# Patient Record
Sex: Female | Born: 1980 | Race: White | Hispanic: No | Marital: Married | Smoking: Never smoker
Health system: Southern US, Community
[De-identification: ages and names within clinical notes are randomized; demographics above are authoritative.]

## PROBLEM LIST (undated history)

## (undated) DIAGNOSIS — I471 Supraventricular tachycardia, unspecified: Secondary | ICD-10-CM

## (undated) DIAGNOSIS — E663 Overweight: Secondary | ICD-10-CM

## (undated) DIAGNOSIS — R000 Tachycardia, unspecified: Secondary | ICD-10-CM

## (undated) DIAGNOSIS — F419 Anxiety disorder, unspecified: Secondary | ICD-10-CM

## (undated) HISTORY — DX: Tachycardia, unspecified: R00.0

## (undated) HISTORY — DX: Overweight: E66.3

## (undated) HISTORY — PX: BREAST ENHANCEMENT SURGERY: SHX7

## (undated) HISTORY — DX: Anxiety disorder, unspecified: F41.9

## (undated) HISTORY — PX: TONSILLECTOMY: SUR1361

## (undated) HISTORY — PX: CHOLECYSTECTOMY: SHX55

---

## 2006-06-18 ENCOUNTER — Emergency Department (HOSPITAL_COMMUNITY): Admission: EM | Admit: 2006-06-18 | Discharge: 2006-06-18 | Payer: Self-pay | Admitting: Emergency Medicine

## 2007-03-30 ENCOUNTER — Ambulatory Visit: Payer: Self-pay | Admitting: Internal Medicine

## 2007-03-30 DIAGNOSIS — K5909 Other constipation: Secondary | ICD-10-CM

## 2007-03-30 DIAGNOSIS — F329 Major depressive disorder, single episode, unspecified: Secondary | ICD-10-CM

## 2007-03-30 DIAGNOSIS — F411 Generalized anxiety disorder: Secondary | ICD-10-CM | POA: Insufficient documentation

## 2007-03-30 DIAGNOSIS — K589 Irritable bowel syndrome without diarrhea: Secondary | ICD-10-CM

## 2007-03-30 DIAGNOSIS — R635 Abnormal weight gain: Secondary | ICD-10-CM

## 2007-03-30 DIAGNOSIS — K219 Gastro-esophageal reflux disease without esophagitis: Secondary | ICD-10-CM | POA: Insufficient documentation

## 2007-03-30 LAB — CONVERTED CEMR LAB
Glucose, Bld: 75 mg/dL (ref 70–99)
Potassium: 4.3 meq/L (ref 3.5–5.3)

## 2007-04-02 ENCOUNTER — Encounter (INDEPENDENT_AMBULATORY_CARE_PROVIDER_SITE_OTHER): Payer: Self-pay | Admitting: Internal Medicine

## 2007-04-03 ENCOUNTER — Encounter (INDEPENDENT_AMBULATORY_CARE_PROVIDER_SITE_OTHER): Payer: Self-pay | Admitting: Family Medicine

## 2007-04-11 ENCOUNTER — Encounter (INDEPENDENT_AMBULATORY_CARE_PROVIDER_SITE_OTHER): Payer: Self-pay | Admitting: Internal Medicine

## 2007-04-27 ENCOUNTER — Ambulatory Visit: Payer: Self-pay | Admitting: Internal Medicine

## 2007-05-02 ENCOUNTER — Telehealth (INDEPENDENT_AMBULATORY_CARE_PROVIDER_SITE_OTHER): Payer: Self-pay | Admitting: *Deleted

## 2007-08-23 ENCOUNTER — Other Ambulatory Visit: Admission: RE | Admit: 2007-08-23 | Discharge: 2007-08-23 | Payer: Self-pay | Admitting: Obstetrics and Gynecology

## 2008-01-18 ENCOUNTER — Ambulatory Visit (HOSPITAL_COMMUNITY): Admission: RE | Admit: 2008-01-18 | Discharge: 2008-01-18 | Payer: Self-pay | Admitting: Family Medicine

## 2008-09-23 ENCOUNTER — Ambulatory Visit (HOSPITAL_COMMUNITY): Admission: RE | Admit: 2008-09-23 | Discharge: 2008-09-23 | Payer: Self-pay | Admitting: Family Medicine

## 2010-10-21 ENCOUNTER — Inpatient Hospital Stay (HOSPITAL_COMMUNITY)
Admission: AD | Admit: 2010-10-21 | Discharge: 2010-10-24 | Payer: Self-pay | Source: Home / Self Care | Admitting: Obstetrics and Gynecology

## 2011-02-01 LAB — CBC
HCT: 26.5 % — ABNORMAL LOW (ref 36.0–46.0)
HCT: 32.6 % — ABNORMAL LOW (ref 36.0–46.0)
Hemoglobin: 8.8 g/dL — ABNORMAL LOW (ref 12.0–15.0)
MCH: 28.1 pg (ref 26.0–34.0)
MCH: 28.3 pg (ref 26.0–34.0)
MCHC: 33.8 g/dL (ref 30.0–36.0)
RBC: 3.13 MIL/uL — ABNORMAL LOW (ref 3.87–5.11)
RDW: 16.2 % — ABNORMAL HIGH (ref 11.5–15.5)
RDW: 16.2 % — ABNORMAL HIGH (ref 11.5–15.5)

## 2011-04-01 ENCOUNTER — Other Ambulatory Visit (HOSPITAL_COMMUNITY): Payer: Self-pay | Admitting: Family Medicine

## 2011-04-01 DIAGNOSIS — R109 Unspecified abdominal pain: Secondary | ICD-10-CM

## 2011-04-04 ENCOUNTER — Ambulatory Visit (HOSPITAL_COMMUNITY)
Admission: RE | Admit: 2011-04-04 | Discharge: 2011-04-04 | Disposition: A | Discharge status: Home / Self Care | Payer: BC Managed Care – PPO | Source: Ambulatory Visit | Attending: Family Medicine | Admitting: Family Medicine

## 2011-04-04 DIAGNOSIS — K8020 Calculus of gallbladder without cholecystitis without obstruction: Secondary | ICD-10-CM | POA: Insufficient documentation

## 2011-04-04 DIAGNOSIS — R109 Unspecified abdominal pain: Secondary | ICD-10-CM

## 2011-04-07 ENCOUNTER — Encounter (HOSPITAL_COMMUNITY): Payer: BC Managed Care – PPO

## 2011-04-07 ENCOUNTER — Other Ambulatory Visit: Payer: Self-pay | Admitting: General Surgery

## 2011-04-07 LAB — HEPATIC FUNCTION PANEL
AST: 19 U/L (ref 0–37)
Albumin: 4.3 g/dL (ref 3.5–5.2)
Alkaline Phosphatase: 83 U/L (ref 39–117)
Bilirubin, Direct: 0.1 mg/dL (ref 0.0–0.3)
Indirect Bilirubin: 0.2 mg/dL — ABNORMAL LOW (ref 0.3–0.9)
Total Bilirubin: 0.3 mg/dL (ref 0.3–1.2)
Total Protein: 7.9 g/dL (ref 6.0–8.3)

## 2011-04-07 LAB — BASIC METABOLIC PANEL
CO2: 29 mEq/L (ref 19–32)
Glucose, Bld: 60 mg/dL — ABNORMAL LOW (ref 70–99)
Sodium: 137 mEq/L (ref 135–145)

## 2011-04-07 LAB — CBC
MCH: 29.7 pg (ref 26.0–34.0)
MCV: 89 fL (ref 78.0–100.0)
Platelets: 277 10*3/uL (ref 150–400)
RBC: 4.08 MIL/uL (ref 3.87–5.11)
WBC: 6.4 10*3/uL (ref 4.0–10.5)

## 2011-04-07 LAB — SURGICAL PCR SCREEN
MRSA, PCR: NEGATIVE
Staphylococcus aureus: NEGATIVE

## 2011-04-07 LAB — HCG, SERUM, QUALITATIVE: Preg, Serum: NEGATIVE

## 2011-04-11 ENCOUNTER — Other Ambulatory Visit: Payer: Self-pay | Admitting: General Surgery

## 2011-04-11 ENCOUNTER — Ambulatory Visit (HOSPITAL_COMMUNITY)
Admission: RE | Admit: 2011-04-11 | Discharge: 2011-04-11 | Disposition: A | Discharge status: Home / Self Care | Payer: BC Managed Care – PPO | Source: Ambulatory Visit | Attending: General Surgery | Admitting: General Surgery

## 2011-04-11 DIAGNOSIS — K8010 Calculus of gallbladder with chronic cholecystitis without obstruction: Secondary | ICD-10-CM | POA: Insufficient documentation

## 2011-04-12 NOTE — H&P (Signed)
  Cynthia Johns, Cynthia Johns                 ACCOUNT NO.:  [id number]  MEDICAL RECORD NO.:  [id number]           PATIENT TYPE:  LOCATION:                                 FACILITY:  PHYSICIAN:  Dalia Heading, M.D.  DATE OF BIRTH:  1981/06/06  DATE OF ADMISSION: DATE OF DISCHARGE:  LH                             HISTORY & PHYSICAL   CHIEF COMPLAINT:  Biliary colic.  HISTORY OF PRESENT ILLNESS:  The patient is a 30 year old white female who presents with right upper quadrant abdominal pain.  She says she has had heartburn and right upper quadrant abdominal pain intermittently for many years.  It is made worse with hunger or food.  No fever, chills, jaundice have been noted.  The pain radiates to the right flank.  She does get nauseated.  PAST MEDICAL HISTORY:  IBS.  PAST SURGICAL HISTORY:  Tonsillectomy and adenoidectomy and breast augmentation.  CURRENT MEDICATIONS:  None.  ALLERGIES:  No known drug allergies.  REVIEW OF SYSTEMS:  The patient denies drinking or smoking.  She denies any other cardiopulmonary difficulties or bleeding disorders.  FAMILY MEDICAL HISTORY:  Noncontributory.  PHYSICAL EXAMINATION:  GENERAL:  The patient is a well-developed, well- nourished white female in no acute distress.  She is afebrile. VITAL SIGNS:  Stable. LUNGS:  Clear to auscultation with equal breath sounds bilaterally. HEART:  Regular rate and rhythm without S3, S4, or murmurs. ABDOMEN:  Soft, nontender, and nondistended.  No hepatosplenomegaly or masses are noted.  No hernias are noted.  LABORATORY DATA:  Ultrasound of the gallbladder reveals cholelithiasis with a normal common bile duct.  IMPRESSION:  Cholecystitis and cholelithiasis.  PLAN:  The patient is scheduled for laparoscopic cholecystectomy on Apr 11, 2011.  The risks and benefits of the procedure including bleeding, infection, hepatobiliary injury and the possibility of an open procedure were fully explained to the patient,  gave informed consent.     Dalia Heading, M.D.     MAJ/MEDQ  D:  04/07/2011  T:  04/08/2011  Job:  [id number]  cc:   Corrie Mckusick, M.D. Fax: [phone]  Electronically Signed by Franky Macho M.D. on 04/12/2011 09:02:41 AM

## 2011-04-12 NOTE — Op Note (Signed)
Cynthia Johns, Cynthia Johns                 ACCOUNT NO.:  [id number]  MEDICAL RECORD NO.:  [id number]           PATIENT TYPE:  O  LOCATION:  [facility]                          FACILITY:  [facility]  PHYSICIAN:  Dalia Heading, M.D.  DATE OF BIRTH:  [birth year]  DATE OF PROCEDURE:  04/11/2011 DATE OF DISCHARGE:                              OPERATIVE REPORT   PREOPERATIVE DIAGNOSES:  Cholecystitis, cholelithiasis.  POSTOPERATIVE DIAGNOSIS:  Cholecystitis, cholelithiasis.  PROCEDURE:  Laparoscopic cholecystectomy.  SURGEON:  Dalia Heading, MD  ANESTHESIA:  General endotracheal.  INDICATIONS:  The patient is a 30 year old white female who presents with biliary colic secondary to cholecystitis, cholelithiasis.  The risks and benefits of the procedure including bleeding, infection, hepatobiliary injury, and the possibly an open procedure were fully explained to the patient, gave informed consent.  PROCEDURE NOTE:  The patient was placed in the supine position.  Afterinduction of general endotracheal anesthesia, the abdomen was prepped and draped using the usual sterile technique with DuraPrep.  Surgical site confirmation was performed.  Infraumbilical incision was made down to the fascia.  A Veress needle was introduced into the abdominal cavity and confirmation of placement was done using the saline drop test.  The abdomen was then insufflated to 16 mmHg pressure.  An 11-mm trocar was introduced into the abdominal cavity under direct visualization without difficulty.  The patient was placed in reversed Trendelenburg position.  An additional 11-mm trocar was placed in the epigastric region and 5-mm trocar was placed in the right upper quadrant and right flank regions.  Liver was inspected and noted to be within normal limits.  The gallbladder was retracted superolaterally.  The dissection began around the infundibulum of the gallbladder.  The cystic duct was first identified.  Its juncture to  the infundibulum was fully identified.  EndoClips were placed proximally and distally on the cystic duct and the cystic duct was divided.  This was likewise done in the cystic artery.  The gallbladder was then freed away from the gallbladder fossa using Bovie electrocautery.  The gallbladder was delivered through the epigastric trocar site using Endocatch bag. The gallbladder fossa was inspected and no abnormal bleeding or bile leakage was noted.  Surgicel was placed in the gallbladder fossa.  All fluid and air were then evacuated from the abdominal cavity prior to removal of the trocars.  All wounds were irrigated with normal saline.  All wounds were checked with 0.5% Sensorcaine.  The infraumbilical fascia as well as epigastric fascia were reapproximated using 0 Vicryl interrupted sutures.  All skin incisions were closed using staples.  Betadine ointment and dry sterile dressings were applied.  All tape and needle counts were correct at the end of the procedure. The patient was extubated in the operating room, went back to recovery room, awake in stable condition.  COMPLICATIONS:  None.  SPECIMEN:  Gallbladder.  BLOOD LOSS:  Minimal.     Dalia Heading, M.D.     MAJ/MEDQ  D:  04/11/2011  T:  04/11/2011  Job:  [id number]  cc:   Corrie Mckusick, M.D. Fax: [phone]  Electronically Signed  by Franky Macho M.D. on 04/12/2011 09:02:43 AM

## 2011-04-20 ENCOUNTER — Other Ambulatory Visit (HOSPITAL_COMMUNITY): Payer: Self-pay | Admitting: Obstetrics & Gynecology

## 2011-04-20 ENCOUNTER — Ambulatory Visit (HOSPITAL_COMMUNITY)
Admission: RE | Admit: 2011-04-20 | Discharge: 2011-04-20 | Disposition: A | Discharge status: Home / Self Care | Payer: BC Managed Care – PPO | Source: Ambulatory Visit | Attending: Obstetrics & Gynecology | Admitting: Obstetrics & Gynecology

## 2011-04-20 DIAGNOSIS — IMO0002 Reserved for concepts with insufficient information to code with codable children: Secondary | ICD-10-CM

## 2011-04-20 DIAGNOSIS — Z30431 Encounter for routine checking of intrauterine contraceptive device: Secondary | ICD-10-CM | POA: Insufficient documentation

## 2011-05-10 ENCOUNTER — Ambulatory Visit (INDEPENDENT_AMBULATORY_CARE_PROVIDER_SITE_OTHER): Payer: Self-pay | Admitting: Internal Medicine

## 2011-08-29 ENCOUNTER — Ambulatory Visit (HOSPITAL_COMMUNITY)
Admission: RE | Admit: 2011-08-29 | Discharge: 2011-08-29 | Disposition: A | Discharge status: Home / Self Care | Payer: PRIVATE HEALTH INSURANCE | Source: Ambulatory Visit | Attending: Family Medicine | Admitting: Family Medicine

## 2011-08-29 ENCOUNTER — Other Ambulatory Visit (HOSPITAL_COMMUNITY): Payer: Self-pay | Admitting: Family Medicine

## 2011-08-29 DIAGNOSIS — M545 Low back pain, unspecified: Secondary | ICD-10-CM | POA: Insufficient documentation

## 2011-08-29 DIAGNOSIS — S335XXA Sprain of ligaments of lumbar spine, initial encounter: Secondary | ICD-10-CM

## 2012-01-17 ENCOUNTER — Other Ambulatory Visit: Payer: Self-pay | Admitting: Obstetrics and Gynecology

## 2013-02-27 ENCOUNTER — Other Ambulatory Visit (HOSPITAL_COMMUNITY): Payer: Self-pay | Admitting: Internal Medicine

## 2013-02-27 DIAGNOSIS — M5430 Sciatica, unspecified side: Secondary | ICD-10-CM

## 2013-02-27 DIAGNOSIS — G8929 Other chronic pain: Secondary | ICD-10-CM

## 2013-03-01 ENCOUNTER — Other Ambulatory Visit (HOSPITAL_COMMUNITY): Payer: PRIVATE HEALTH INSURANCE

## 2013-03-05 ENCOUNTER — Ambulatory Visit (HOSPITAL_COMMUNITY)
Admission: RE | Admit: 2013-03-05 | Discharge: 2013-03-05 | Disposition: A | Discharge status: Home / Self Care | Payer: PRIVATE HEALTH INSURANCE | Source: Ambulatory Visit | Attending: Internal Medicine | Admitting: Internal Medicine

## 2013-03-05 DIAGNOSIS — G8929 Other chronic pain: Secondary | ICD-10-CM

## 2013-03-05 DIAGNOSIS — M545 Low back pain, unspecified: Secondary | ICD-10-CM | POA: Insufficient documentation

## 2013-03-05 DIAGNOSIS — M5430 Sciatica, unspecified side: Secondary | ICD-10-CM

## 2013-03-05 DIAGNOSIS — M5126 Other intervertebral disc displacement, lumbar region: Secondary | ICD-10-CM | POA: Insufficient documentation

## 2013-09-26 ENCOUNTER — Other Ambulatory Visit: Payer: Self-pay | Admitting: Obstetrics and Gynecology

## 2014-01-21 ENCOUNTER — Other Ambulatory Visit: Payer: Self-pay | Admitting: Obstetrics and Gynecology

## 2014-01-21 DIAGNOSIS — N644 Mastodynia: Secondary | ICD-10-CM

## 2014-10-23 ENCOUNTER — Other Ambulatory Visit: Payer: Self-pay | Admitting: Obstetrics and Gynecology

## 2014-10-24 LAB — CYTOLOGY - PAP

## 2015-02-06 ENCOUNTER — Encounter (INDEPENDENT_AMBULATORY_CARE_PROVIDER_SITE_OTHER): Payer: Self-pay | Admitting: *Deleted

## 2015-03-03 ENCOUNTER — Ambulatory Visit (INDEPENDENT_AMBULATORY_CARE_PROVIDER_SITE_OTHER): Payer: PRIVATE HEALTH INSURANCE | Admitting: Internal Medicine

## 2015-04-22 ENCOUNTER — Encounter (INDEPENDENT_AMBULATORY_CARE_PROVIDER_SITE_OTHER): Payer: Self-pay | Admitting: *Deleted

## 2015-09-21 ENCOUNTER — Other Ambulatory Visit: Payer: Self-pay | Admitting: Obstetrics and Gynecology

## 2015-09-21 DIAGNOSIS — N6310 Unspecified lump in the right breast, unspecified quadrant: Secondary | ICD-10-CM

## 2015-09-25 ENCOUNTER — Ambulatory Visit
Admission: RE | Admit: 2015-09-25 | Discharge: 2015-09-25 | Disposition: A | Discharge status: Home / Self Care | Payer: PRIVATE HEALTH INSURANCE | Source: Ambulatory Visit | Attending: Obstetrics and Gynecology | Admitting: Obstetrics and Gynecology

## 2015-09-25 ENCOUNTER — Other Ambulatory Visit: Payer: Self-pay | Admitting: Obstetrics and Gynecology

## 2015-09-25 DIAGNOSIS — N6310 Unspecified lump in the right breast, unspecified quadrant: Secondary | ICD-10-CM

## 2016-03-16 ENCOUNTER — Other Ambulatory Visit: Payer: Self-pay | Admitting: Obstetrics and Gynecology

## 2016-03-17 LAB — CYTOLOGY - PAP

## 2017-05-15 ENCOUNTER — Encounter (HOSPITAL_COMMUNITY): Payer: Self-pay | Admitting: Emergency Medicine

## 2017-05-15 ENCOUNTER — Emergency Department (HOSPITAL_COMMUNITY): Payer: PRIVATE HEALTH INSURANCE

## 2017-05-15 ENCOUNTER — Emergency Department (HOSPITAL_COMMUNITY)
Admission: EM | Admit: 2017-05-15 | Discharge: 2017-05-15 | Disposition: A | Discharge status: Home / Self Care | Payer: PRIVATE HEALTH INSURANCE | Attending: Emergency Medicine | Admitting: Emergency Medicine

## 2017-05-15 DIAGNOSIS — R002 Palpitations: Secondary | ICD-10-CM | POA: Diagnosis present

## 2017-05-15 DIAGNOSIS — Z79899 Other long term (current) drug therapy: Secondary | ICD-10-CM | POA: Insufficient documentation

## 2017-05-15 HISTORY — DX: Supraventricular tachycardia, unspecified: I47.10

## 2017-05-15 HISTORY — DX: Supraventricular tachycardia: I47.1

## 2017-05-15 LAB — COMPREHENSIVE METABOLIC PANEL
ALBUMIN: 4 g/dL (ref 3.5–5.0)
ALK PHOS: 38 U/L (ref 38–126)
ALT: 17 U/L (ref 14–54)
ANION GAP: 9 (ref 5–15)
AST: 18 U/L (ref 15–41)
BILIRUBIN TOTAL: 0.6 mg/dL (ref 0.3–1.2)
BUN: 12 mg/dL (ref 6–20)
CALCIUM: 9.2 mg/dL (ref 8.9–10.3)
CO2: 23 mmol/L (ref 22–32)
Chloride: 105 mmol/L (ref 101–111)
Creatinine, Ser: 0.79 mg/dL (ref 0.44–1.00)
GFR calc Af Amer: >60 mL/min (ref >60)
GFR calc non Af Amer: >60 mL/min (ref >60)
GLUCOSE: 79 mg/dL (ref 65–99)
Potassium: 3.8 mmol/L (ref 3.5–5.1)
SODIUM: 137 mmol/L (ref 135–145)
TOTAL PROTEIN: 7.9 g/dL (ref 6.5–8.1)

## 2017-05-15 LAB — CBC WITH DIFFERENTIAL/PLATELET
BASOS ABS: 0 10*3/uL (ref 0.0–0.1)
BASOS PCT: 1 %
EOS ABS: 0.1 10*3/uL (ref 0.0–0.7)
Eosinophils Relative: 1 %
HEMATOCRIT: 38.9 % (ref 36.0–46.0)
HEMOGLOBIN: 13.4 g/dL (ref 12.0–15.0)
Lymphocytes Relative: 36 %
Lymphs Abs: 2.1 10*3/uL (ref 0.7–4.0)
MCH: 31.3 pg (ref 26.0–34.0)
MCHC: 34.4 g/dL (ref 30.0–36.0)
MCV: 90.9 fL (ref 78.0–100.0)
Monocytes Absolute: 0.4 10*3/uL (ref 0.1–1.0)
Monocytes Relative: 7 %
NEUTROS ABS: 3.1 10*3/uL (ref 1.7–7.7)
NEUTROS PCT: 55 %
Platelets: 276 10*3/uL (ref 150–400)
RBC: 4.28 MIL/uL (ref 3.87–5.11)
RDW: 12.4 % (ref 11.5–15.5)
WBC: 5.7 10*3/uL (ref 4.0–10.5)

## 2017-05-15 LAB — TROPONIN I: Troponin I: <0.03 ng/mL (ref <0.03)

## 2017-05-15 MED ORDER — KETOROLAC TROMETHAMINE 30 MG/ML IJ SOLN
30.0000 mg | Freq: Once | INTRAMUSCULAR | Status: AC
  Administered 2017-05-15: 30 mg via INTRAVENOUS
  Filled 2017-05-15: qty 1

## 2017-05-15 MED ORDER — SODIUM CHLORIDE 0.9 % IV BOLUS (SEPSIS)
500.0000 mL | Freq: Once | INTRAVENOUS | Status: AC
  Administered 2017-05-15: 500 mL via INTRAVENOUS

## 2017-05-15 NOTE — ED Provider Notes (Signed)
[facility]-EMERG[facility]Y DEPT Provider Note   CSN: [id number] Arrival date & time: 05/15/17  4098     History   Chief Complaint Chief Complaint  Patient presents with  . Palpitations    HPI Cynthia Johns is a 36 y.o. female.  Pt states she had palpitaions with a heart rate at 180 last night.  She feels exhausted today.  No more palpitations   The history is provided by the patient. No language interpreter was used.  Palpitations   This is a recurrent problem. The current episode started 6 to 12 hours ago. The problem occurs rarely. The problem has been resolved. Associated with: Sleep. Pertinent negatives include no fever, no chest pain, no abdominal pain, no headaches, no back pain and no cough. She has tried nothing for the symptoms. The treatment provided no relief. There are no known risk factors.    Past Medical History:  Diagnosis Date  . A-fib (HCC)   . SVT (supraventricular tachycardia) [facility])     Patient Active Problem List   Diagnosis Date Noted  . ANXIETY 03/30/2007  . DEPRESSION 03/30/2007  . GERD 03/30/2007  . CONSTIPATION, CHRONIC 03/30/2007  . IBS 03/30/2007  . WEIGHT GAIN, ABNORMAL 03/30/2007    Past Surgical History:  Procedure Laterality Date  . BREAST ENHANCEMENT SURGERY    . CHOLECYSTECTOMY    . TONSILLECTOMY      OB History    No data available       Home Medications    Prior to Admission medications   Medication Sig Start Date End Date Taking? Authorizing Provider  BEPREVE 1.5 % SOLN INSTILL ONE DROP IN [facility] EYE TWICE DAILY 04/04/17  Yes [provider]  buPROPion (WELLBUTRIN XL) 300 MG 24 hr tablet Take 300 mg by mouth daily. 03/02/17  Yes [provider]  NUVARING 0.12-0.015 MG/24HR vaginal ring INSERT AND LEAVE VAGINALLY EVERY WEEK CONTINUOUSLY FOR 3 MONTHS, THEN OUT FOR ONE WEEK 05/12/17  Yes [provider]  phentermine 37.5 MG capsule Take 37.5 mg by mouth every morning.   Yes [provider]    Family  History No family history on file.  Social History Social History  Substance Use Topics  . Smoking status: Never Smoker  . Smokeless tobacco: Not on file  . Alcohol use No     Allergies   Patient has no known allergies.   Review of Systems Review of Systems  Constitutional: Negative for appetite change, fatigue and fever.  HENT: Negative for congestion, ear discharge and sinus pressure.   Eyes: Negative for discharge.  Respiratory: Negative for cough.   Cardiovascular: Positive for palpitations. Negative for chest pain.  Gastrointestinal: Negative for abdominal pain and diarrhea.  Genitourinary: Negative for frequency and hematuria.  Musculoskeletal: Negative for back pain.  Skin: Negative for rash.  Neurological: Negative for seizures and headaches.  Psychiatric/Behavioral: Negative for hallucinations.     Physical Exam Updated Vital Signs BP 114/76   Pulse 73   Temp 98.4 F (36.9 C)   Resp 17   Ht 5\' 11"  (1.803 m)   Wt 83.9 kg (185 lb)   LMP 03/15/2017   SpO2 100%   BMI 25.80 kg/m   Physical Exam  Constitutional: She is oriented to person, place, and time. She appears well-developed.  HENT:  Head: Normocephalic.  Eyes: Conjunctivae and EOM are normal. No scleral icterus.  Neck: Neck supple. No thyromegaly present.  Cardiovascular: Normal rate and regular rhythm.  Exam reveals no gallop and no  friction rub.   No murmur heard. Pulmonary/Chest: No stridor. She has no wheezes. She has no rales. She exhibits no tenderness.  Abdominal: She exhibits no distension. There is no tenderness. There is no rebound.  Musculoskeletal: Normal range of motion. She exhibits no edema.  Lymphadenopathy:    She has no cervical adenopathy.  Neurological: She is oriented to person, place, and time. She exhibits normal muscle tone. Coordination normal.  Skin: No rash noted. No erythema.  Psychiatric: She has a normal mood and affect. Her behavior is normal.     ED Treatments  / Results  Labs (all labs ordered are listed, but only abnormal results are displayed) Labs Reviewed  CBC WITH DIFFERENTIAL/PLATELET  COMPREHENSIVE METABOLIC PANEL  TROPONIN I    EKG  EKG Interpretation  Date/Time:  Monday May 15 2017 09:50:34 EDT Ventricular Rate:  96 PR Interval:    QRS Duration: 112 QT Interval:  387 QTC Calculation: 490 R Axis:   65 Text Interpretation:  Sinus rhythm Borderline short PR interval Borderline intraventricular conduction delay Low voltage, precordial leads Borderline T abnormalities, diffuse leads Borderline prolonged QT interval Confirmed by Bethann BerkshireZammit, Sigrid Schwebach [id number]) on 05/15/2017 10:42:23 AM       Radiology Dg Chest 2 View  Result Date: 05/15/2017 CLINICAL DATA:  Sweating and palpitations. EXAM: CHEST  2 VIEW COMPARISON:  None. FINDINGS: The heart size and mediastinal contours are within normal limits. Both lungs are clear. The visualized skeletal structures are unremarkable. IMPRESSION: No active cardiopulmonary disease. Electronically Signed   By: Richarda OverlieAdam  Henn M.D.   On: 05/15/2017 12:26    Procedures Procedures (including critical care time)  Medications Ordered in ED Medications  ketorolac (TORADOL) 30 MG/ML injection 30 mg (30 mg Intravenous Given 05/15/17 1159)  sodium chloride 0.9 % bolus 500 mL (0 mLs Intravenous Stopped 05/15/17 1250)     Initial Impression / Assessment and Plan / ED Course  I have reviewed the triage vital signs and the nursing notes.  Pertinent labs & imaging results that were available during my care of the patient were reviewed by me and considered in my medical decision making (see chart for details).     Labs and EKG unremarkable. Patient has been instructed to return to the emergency department if her heart is rapid again. She is to follow-up with her PCP next week  Final Clinical Impressions(s) / ED Diagnoses   Final diagnoses:  Palpitations    New Prescriptions New Prescriptions   No medications  on file     Bethann BerkshireZammit, Rella Egelston, MD 05/15/17 1504

## 2017-05-15 NOTE — ED Triage Notes (Signed)
Pt c/o sweating, palpitations and ha/n that woke her up at 0000 this am. Pt reports seeing her pcp this am and was sent to ED for abnormal EKG. Pt reports ha, chest heaviness and nausea at present. Pt reports history of SVT and Afib during pregnancy 6 years ago. Denies any medications at present. nad noted.

## 2017-05-15 NOTE — Discharge Instructions (Signed)
Follow up with your md next week.  Return sooner if problems °

## 2017-05-15 NOTE — ED Notes (Signed)
ED Provider at bedside. 

## 2017-06-01 ENCOUNTER — Ambulatory Visit (INDEPENDENT_AMBULATORY_CARE_PROVIDER_SITE_OTHER): Payer: PRIVATE HEALTH INSURANCE | Admitting: Cardiovascular Disease

## 2017-06-01 ENCOUNTER — Encounter: Payer: Self-pay | Admitting: Cardiovascular Disease

## 2017-06-01 VITALS — BP 128/92 | HR 91 | Ht 70.0 in | Wt 191.0 lb

## 2017-06-01 DIAGNOSIS — I471 Supraventricular tachycardia: Secondary | ICD-10-CM

## 2017-06-01 DIAGNOSIS — R002 Palpitations: Secondary | ICD-10-CM | POA: Diagnosis not present

## 2017-06-01 DIAGNOSIS — R030 Elevated blood-pressure reading, without diagnosis of hypertension: Secondary | ICD-10-CM

## 2017-06-01 DIAGNOSIS — R5383 Other fatigue: Secondary | ICD-10-CM

## 2017-06-01 NOTE — Progress Notes (Signed)
CARDIOLOGY CONSULT NOTE  Patient ID: Cynthia Johns MRN: [id number] DOB/AGE: 1980-12-27 36 y.o.  Admit date: (Not on file) Primary Physician: Assunta Found, MD Referring Physician: Phillips Odor  Reason for Consultation: palpitations  HPI: Cynthia Johns is a 37 y.o. female who is being seen today for the evaluation of palpitations at the request of Assunta Found, MD.   She was evaluated in the ED on 05/15/17. I personally reviewed all relevant documentation, labs, and studies.  CBC, comprehensive metabolic panel, and troponin were normal.  Chest x-ray showed no active cardiopulmonary disease.  I personally reviewed the ECG performed on 05/15/17 which demonstrated sinus rhythm with nonspecific T-wave abnormalities.  She tells me she first began experiencing palpitations in 2011 and she was pregnant. She wore a monitor for one to two weeks and said her heart rate was 170 bpm. She was told she had SVT.  She tells me TSH has been checked several times and was normal. She has been experiencing chronic fatigue. She denies sleepiness. While she snores her husband denies witnessed apneic episodes.  She denies chest pain and shortness of breath.  She has a history of anxiety and panic attacks.  She drinks 2 sodas daily. She occasionally eats chocolate. She does not drink energy drinks.  Her heart rate is monitored by her watch. Before going to the ED, it registered at 181 bpm. It awoke her from sleep.  She occasionally has a "splotchy rash "on her chest. She denies associated pruritus and fevers.   No Known Allergies  Current Outpatient Prescriptions  Medication Sig Dispense Refill  . buPROPion (WELLBUTRIN XL) 300 MG 24 hr tablet Take 300 mg by mouth daily.  2  . NUVARING 0.12-0.015 MG/24HR vaginal ring INSERT AND LEAVE VAGINALLY EVERY WEEK CONTINUOUSLY FOR 3 MONTHS, THEN OUT FOR ONE WEEK  1  . sertraline (ZOLOFT) 100 MG tablet Take 100 mg by mouth at bedtime.     No current  facility-administered medications for this visit.     Past Medical History:  Diagnosis Date  . A-fib (HCC)   . SVT (supraventricular tachycardia) (HCC)     Past Surgical History:  Procedure Laterality Date  . BREAST ENHANCEMENT SURGERY    . CHOLECYSTECTOMY    . TONSILLECTOMY      Social History   Social History  . Marital status: Married    Spouse name: N/A  . Number of children: N/A  . Years of education: N/A   Occupational History  . Not on file.   Social History Main Topics  . Smoking status: Never Smoker  . Smokeless tobacco: Never Used  . Alcohol use No  . Drug use: No  . Sexual activity: Yes    Birth control/ protection: Inserts   Other Topics Concern  . Not on file   Social History Narrative  . No narrative on file     No family history of premature CAD in 1st degree relatives.  Current Meds  Medication Sig  . buPROPion (WELLBUTRIN XL) 300 MG 24 hr tablet Take 300 mg by mouth daily.  Marland Kitchen NUVARING 0.12-0.015 MG/24HR vaginal ring INSERT AND LEAVE VAGINALLY EVERY WEEK CONTINUOUSLY FOR 3 MONTHS, THEN OUT FOR ONE WEEK  . sertraline (ZOLOFT) 100 MG tablet Take 100 mg by mouth at bedtime.      Review of systems complete and found to be negative unless listed above in HPI    Physical exam Blood pressure (!) 128/92, pulse 91, height 5'  10" (1.778 m), weight 191 lb (86.6 kg), SpO2 99 %. General: NAD Neck: No JVD, no thyromegaly or thyroid nodule.  Lungs: Clear to auscultation bilaterally with normal respiratory effort. CV: Nondisplaced PMI. Regular rate and rhythm, normal S1/S2, no S3/S4, no murmur.  No peripheral edema.  No carotid bruit.    Abdomen: Soft, nontender, no distention.  Skin: Intact without lesions or rashes.  Neurologic: Alert and oriented x 3.  Psych: Normal affect. Extremities: No clubbing or cyanosis.  HEENT: Normal.   ECG: Most recent ECG reviewed.   Labs: Lab Results  Component Value Date/Time   K 3.8 05/15/2017 11:52 AM    BUN 12 05/15/2017 11:52 AM   CREATININE 0.79 05/15/2017 11:52 AM   ALT 17 05/15/2017 11:52 AM   TSH 2.135 03/30/2007 09:42 PM   HGB 13.4 05/15/2017 11:52 AM     Lipids: No results found for: LDLCALC, LDLDIRECT, CHOL, TRIG, HDL      ASSESSMENT AND PLAN:  1. Palpitations with history of supraventricular tachycardia: No metabolic abnormalities nor anemia to explain symptoms when evaluated in the ED. Sinus rhythm seen on ECG. TSH reportedly normal. I will order a 2-D echocardiogram with Doppler to evaluate cardiac structure, function, and regional wall motion. I will order a 30 day event monitor to evaluate for SVT. I told her about potential treatments including medical therapy and EP study with possible ablation.  2. Mildly elevated diastolic BP: I will monitor.  3. Chronic fatigue: I will check a vitamin D and B-12 level. No history to suggest sleep apnea.   Disposition: Follow up in 2 months  Signed: Prentice DockerSuresh Manon Banbury, M.D., F.A.C.C.  06/01/2017, 1:41 PM

## 2017-06-01 NOTE — Patient Instructions (Signed)
Medication Instructions:  Continue all current medications.  Labwork: Vitamin D, Vitamin B12 - orders given today.   Testing/Procedures: Your physician has recommended that you wear a 30 day event monitor. Event monitors are medical devices that record the heart's electrical activity. Doctors most often us these monitors to diagnose arrhythmias. Arrhythmias are problems with the speed or rhythm of the heartbeat. The monitor is a small, portable device. You can wear one while you do your normal daily activities. This is usually used to diagnose what is causing palpitations/syncope (passing out).  Follow-Up:  Office will contact with results via phone or letter.    2 months   Any Other Special Instructions Will Be Listed Below (If Applicable).  If you need a refill on your cardiac medications before your next appointment, please call your pharmacy.

## 2017-06-07 ENCOUNTER — Ambulatory Visit (INDEPENDENT_AMBULATORY_CARE_PROVIDER_SITE_OTHER): Payer: PRIVATE HEALTH INSURANCE

## 2017-06-07 DIAGNOSIS — I471 Supraventricular tachycardia: Secondary | ICD-10-CM

## 2017-06-07 DIAGNOSIS — R002 Palpitations: Secondary | ICD-10-CM

## 2017-06-08 ENCOUNTER — Encounter (INDEPENDENT_AMBULATORY_CARE_PROVIDER_SITE_OTHER): Payer: PRIVATE HEALTH INSURANCE

## 2017-06-08 DIAGNOSIS — I471 Supraventricular tachycardia: Secondary | ICD-10-CM

## 2017-06-08 DIAGNOSIS — R002 Palpitations: Secondary | ICD-10-CM

## 2017-06-09 ENCOUNTER — Telehealth: Payer: Self-pay | Admitting: Cardiovascular Disease

## 2017-06-09 ENCOUNTER — Encounter: Payer: Self-pay | Admitting: *Deleted

## 2017-06-09 NOTE — Telephone Encounter (Signed)
[facility] and will fax to [place] office for MD review with EKG - verified with medical records this was only EKG done which showed HR of 107 - also faxed baseline from event monitor

## 2017-06-09 NOTE — Telephone Encounter (Signed)
Pt will continue Atenolol for now - c/o headache and will take tylenol for this - says she will update us Monday

## 2017-06-09 NOTE — Telephone Encounter (Signed)
[facility] with Heart Rate of 225 for 30 minutes.  They prescribed her Atenolol 25mg   She would like to know if she should take this medication and what else she should do.

## 2017-06-09 NOTE — Telephone Encounter (Signed)
Atenolol would be fine for now. Please try and obtain ECG or telemetry strips demonstrating HR of 220 bpm. Also obtain event monitor strips showing this.

## 2017-06-09 NOTE — Telephone Encounter (Signed)
Pt says last night after work she laid down and started feeling heart racing - [facility] says HR was 220 was given Atenolol 25 mg while in ED - says when she was d/c'd they gave her rx for Atenolol 25 mg - pt wanted to know if Dr Purvis SheffieldKoneswaran was ok with her taking this or recommends something else. Pt was still wearing event monitor (baseline reference showed sinus tach with HR 109-110) Will [facility]

## 2017-06-12 ENCOUNTER — Encounter: Payer: Self-pay | Admitting: Cardiovascular Disease

## 2017-06-13 ENCOUNTER — Encounter: Payer: Self-pay | Admitting: *Deleted

## 2017-06-22 ENCOUNTER — Telehealth: Payer: Self-pay | Admitting: *Deleted

## 2017-06-22 NOTE — Telephone Encounter (Signed)
Notes recorded by Lesle ChrisHill, Shaterria Sager G, LPN on 4/0/98118/12/2016 at 8:21 AM EDT Patient notified yesterday evening. Copy to pmd. Follow up scheduled for 08/09/2017 with Dr. Purvis SheffieldKoneswaran. ------  Notes recorded by Laqueta LindenKoneswaran, Suresh A, MD on 06/21/2017 at 9:29 AM EDT Both Vit B12 and D are mildly low. Would speak to PCP about repletement.

## 2017-07-12 ENCOUNTER — Encounter (INDEPENDENT_AMBULATORY_CARE_PROVIDER_SITE_OTHER): Payer: Self-pay | Admitting: Internal Medicine

## 2017-07-12 ENCOUNTER — Telehealth: Payer: Self-pay | Admitting: *Deleted

## 2017-07-12 NOTE — Telephone Encounter (Signed)
Notes recorded by Lesle Chris, LPN on 07/08/2992 at 1:14 PM EDT Patient notified. Patient is no longer on Atenolol due to side effects. (pmd) Dr. Phillips Odor changed her to Lopressor 25mg  twice a day on 07/03/2017. Still c/o feeling very fatigued. Now heart rate running 63-120'ish. Feels like heart is pounding more during elevated heart rate episodes. But stated that she does not notice the palpitations as much. Has also now began Vitamin D 50,000 units weekly & Vitamin B12 every 4 weeks. Follow up already scheduled for 08/09/2017 with Dr. Purvis Sheffield. Message fwd to provider for any further recommendations. ------  Notes recorded by Laqueta Linden, MD on 07/11/2017 at 2:02 PM EDT Paroxysms of SVT documented (fastest HR 223 bpm). How is she feeling with atenolol? Less frequent palpitations?

## 2017-07-13 ENCOUNTER — Other Ambulatory Visit: Payer: Self-pay | Admitting: *Deleted

## 2017-07-13 MED ORDER — DILTIAZEM HCL 30 MG PO TABS: 30.0000 mg | ORAL_TABLET | Freq: Two times a day (BID) | ORAL | 6 refills | Status: DC

## 2017-07-13 NOTE — Telephone Encounter (Signed)
If metoprolol causing sluggishness, switch to diltiazem 30 mg bid. I can titrate up as needed for symptom relief.

## 2017-07-13 NOTE — Telephone Encounter (Signed)
Patient notified.  She is currently on vacation.  But, would like to try something different.  Stated she will continue the Lopressor till she comes home (giving time for adjustment to medication) & if still feeling fatigued she will make the change to the Diltiazem once she comes home.  New medication sent to [facility] today.  Follow up OV already scheduled for 08/09/2017 with Dr. Purvis Sheffield.

## 2017-07-18 ENCOUNTER — Telehealth: Payer: Self-pay | Admitting: Cardiovascular Disease

## 2017-07-18 DIAGNOSIS — I471 Supraventricular tachycardia: Secondary | ICD-10-CM

## 2017-07-18 NOTE — Telephone Encounter (Signed)
Patient verbalized understanding. Referral placed.

## 2017-07-18 NOTE — Telephone Encounter (Signed)
As she has not tolerated either beta blockers or calcium channel blockers, please make a referral to EP for consideration for ablation.

## 2017-07-18 NOTE — Telephone Encounter (Signed)
diltiazem (CARDIZEM) 30 MG tablet   Started medication yesterday  She has taken three doses.  She is experiencing the following symptoms and doesn't think she can take medication  Shakey Heart Rate up to 128 Bad headaches

## 2017-07-18 NOTE — Telephone Encounter (Signed)
Patient states she believes she is having a reaction to Diltiazem. Patient states she took medication twice yesterday and once today. HR this morning was 128, patient is shaky and has a bad headache. Patient states when she got to work and rested her BP was 111/70 HR at rest is 100. Patient states she is also very nauseated

## 2017-08-03 ENCOUNTER — Ambulatory Visit (INDEPENDENT_AMBULATORY_CARE_PROVIDER_SITE_OTHER): Payer: PRIVATE HEALTH INSURANCE | Admitting: Internal Medicine

## 2017-08-03 ENCOUNTER — Other Ambulatory Visit (HOSPITAL_COMMUNITY): Payer: Self-pay | Admitting: Family Medicine

## 2017-08-03 ENCOUNTER — Encounter (INDEPENDENT_AMBULATORY_CARE_PROVIDER_SITE_OTHER): Payer: Self-pay | Admitting: Internal Medicine

## 2017-08-03 VITALS — BP 140/100 | HR 80 | Temp 98.3°F | Ht 71.0 in | Wt 193.8 lb

## 2017-08-03 DIAGNOSIS — K219 Gastro-esophageal reflux disease without esophagitis: Secondary | ICD-10-CM | POA: Diagnosis not present

## 2017-08-03 DIAGNOSIS — K582 Mixed irritable bowel syndrome: Secondary | ICD-10-CM

## 2017-08-03 DIAGNOSIS — N644 Mastodynia: Secondary | ICD-10-CM

## 2017-08-03 MED ORDER — DICYCLOMINE HCL 10 MG PO CAPS: 10.0000 mg | ORAL_CAPSULE | Freq: Three times a day (TID) | ORAL | 0 refills | Status: DC

## 2017-08-03 MED ORDER — PANTOPRAZOLE SODIUM 40 MG PO TBEC: 40.0000 mg | DELAYED_RELEASE_TABLET | Freq: Every day | ORAL | 3 refills | Status: DC

## 2017-08-03 MED ORDER — DICYCLOMINE HCL 10 MG PO CAPS
10.0000 mg | ORAL_CAPSULE | Freq: Three times a day (TID) | ORAL | 0 refills | Status: DC
Start: 2017-08-03 — End: 2017-08-03

## 2017-08-03 NOTE — Patient Instructions (Addendum)
Rx for Dicyclomine  TID Rx for Protonix Stool softener

## 2017-08-03 NOTE — Progress Notes (Signed)
   Subjective:    Patient ID: Cynthia Johns, female    DOB: [birth year], 36 y.o.   MRN: [id number]  HPI Referred by Dr. Phillips OdorGolding for abdominal pain. She tells me she has had stomach problems for years. Has seen Dr. Marline Backboneeman in the past.  She has never had a colonoscopy in the past.  She has had a barium swallow years ago.  Sometimes she has diarrhea. She cannot wait. Sometimes she has constipation. She says she has hemorrhoids.  Sometimes she has spasms in her rectum. She cannot eat popcorn, she is miserable. She has a lot of gas. She has tried to lactaid, gas aid. She does not take anything for there constipation/diarrhea.  She has urgency. Her appetite is good.  She does have some acid reflux. She has cut back on her sodas.  She does not eat popcorn anymore. She cannot greasy foods. She will have pain with greasy foods.  Family hx of Crohn's disease (maternal aunts). Hx of SVT and atrial fib.  Review of Systems Past Medical History:  Diagnosis Date  . A-fib (HCC)   . SVT (supraventricular tachycardia) (HCC)     Past Surgical History:  Procedure Laterality Date  . BREAST ENHANCEMENT SURGERY    . CHOLECYSTECTOMY    . TONSILLECTOMY      No Known Allergies  Current Outpatient Prescriptions on File Prior to Visit  Medication Sig Dispense Refill  . buPROPion (WELLBUTRIN XL) 300 MG 24 hr tablet Take 300 mg by mouth daily.  2  . cyanocobalamin (,VITAMIN B-12,) 1000 MCG/ML injection Inject 1 mL (1,000 mcg total) into the muscle every 30 (thirty) days.    Marland Kitchen. NUVARING 0.12-0.015 MG/24HR vaginal ring INSERT AND LEAVE VAGINALLY EVERY WEEK CONTINUOUSLY FOR 3 MONTHS, THEN OUT FOR ONE WEEK  1  . Vitamin D, Ergocalciferol, (DRISDOL) 50000 units CAPS capsule Take 1 capsule (50,000 Units total) by mouth every 7 (seven) days.     No current facility-administered medications on file prior to visit.         Objective:   Physical Exam Blood pressure (!) 140/100, pulse 80, temperature 98.3 F (36.8  C), height 5\' 11"  (1.803 m), weight 193 lb 12.8 oz (87.9 kg). Alert and oriented. Skin warm and dry. Oral mucosa is moist.   . Sclera anicteric, conjunctivae is pink. Thyroid not enlarged. No cervical lymphadenopathy. Lungs clear. Heart regular rate and rhythm.  Abdomen is soft. Bowel sounds are positive. No hepatomegaly. No abdominal masses felt. No tenderness.  No edema to lower extremities.           Assessment & Plan:  Constipation/diarrhea. Am going to try on on Dicycomine. GERD: Rx for Protonix 40mg  daily. OV in 3 months.

## 2017-08-09 ENCOUNTER — Encounter: Payer: Self-pay | Admitting: Cardiovascular Disease

## 2017-08-09 ENCOUNTER — Ambulatory Visit (INDEPENDENT_AMBULATORY_CARE_PROVIDER_SITE_OTHER): Payer: PRIVATE HEALTH INSURANCE | Admitting: Cardiovascular Disease

## 2017-08-09 VITALS — BP 122/92 | HR 99 | Ht 70.0 in | Wt 196.0 lb

## 2017-08-09 DIAGNOSIS — R5383 Other fatigue: Secondary | ICD-10-CM

## 2017-08-09 DIAGNOSIS — R030 Elevated blood-pressure reading, without diagnosis of hypertension: Secondary | ICD-10-CM | POA: Diagnosis not present

## 2017-08-09 DIAGNOSIS — R002 Palpitations: Secondary | ICD-10-CM | POA: Diagnosis not present

## 2017-08-09 DIAGNOSIS — I471 Supraventricular tachycardia: Secondary | ICD-10-CM | POA: Diagnosis not present

## 2017-08-09 NOTE — Patient Instructions (Signed)
Medication Instructions:  Continue all current medications.  Labwork: none  Testing/Procedures: none  Follow-Up: As needed.    Any Other Special Instructions Will Be Listed Below (If Applicable).  If you need a refill on your cardiac medications before your next appointment, please call your pharmacy.  

## 2017-08-09 NOTE — Progress Notes (Signed)
SUBJECTIVE: The patient presents for follow-up of paroxysmal supraventricular tachycardia. She has not tolerated either beta blockers or calcium channel blockers. For this reason I placed an EP referral. She has not seen them yet.  Echocardiogram 06/07/17: Normal left ventricular systolic function and regional wall motion, LVEF 60-65%, mild mitral regurgitation.  Event monitoring demonstrated paroxysmal supraventricular tachycardia with the fastest heart rate of 223 bpm. She also had sinus tachycardia. Symptoms correlated with PSVT.  She tells me that her symptoms correlate at about the time she has to change her vaginal ring containing etonogestrel-ethinyl estradiol.  She spoke with her gynecologist about this but the patient tells me her gynecologist said that the vaginal ring should apply a steady state of hormone replacement. The patient questions this reasoning because the ring has to be replaced every month.  She said her energy levels have improved to some degree.   Review of Systems: As per "subjective", otherwise negative.  No Known Allergies  Current Outpatient Prescriptions  Medication Sig Dispense Refill  . buPROPion (WELLBUTRIN XL) 300 MG 24 hr tablet Take 300 mg by mouth daily.  2  . Butalbital-Acetaminophen (BUTALBITAL-APAP) 50-325 MG TABS Take by mouth. And  caffeine    . cyanocobalamin (,VITAMIN B-12,) 1000 MCG/ML injection Inject 1 mL (1,000 mcg total) into the muscle every 30 (thirty) days.    Marland Kitchen dicyclomine (BENTYL) 10 MG capsule Take 1 capsule (10 mg total) by mouth 3 (three) times daily before meals. 90 capsule 0  . NUVARING 0.12-0.015 MG/24HR vaginal ring INSERT AND LEAVE VAGINALLY EVERY WEEK CONTINUOUSLY FOR 3 MONTHS, THEN OUT FOR ONE WEEK  1  . ondansetron (ZOFRAN) 4 MG tablet Take 4 mg by mouth every 8 (eight) hours as needed for nausea or vomiting.    . pantoprazole (PROTONIX) 40 MG tablet Take 1 tablet (40 mg total) by mouth daily. 30 tablet 3  .  Vitamin D, Ergocalciferol, (DRISDOL) 50000 units CAPS capsule Take 1 capsule (50,000 Units total) by mouth every 7 (seven) days.     No current facility-administered medications for this visit.     Past Medical History:  Diagnosis Date  . A-fib (HCC)   . SVT (supraventricular tachycardia) (HCC)     Past Surgical History:  Procedure Laterality Date  . BREAST ENHANCEMENT SURGERY    . CHOLECYSTECTOMY    . TONSILLECTOMY      Social History   Social History  . Marital status: Married    Spouse name: N/A  . Number of children: N/A  . Years of education: N/A   Occupational History  . Not on file.   Social History Main Topics  . Smoking status: Never Smoker  . Smokeless tobacco: Never Used  . Alcohol use No  . Drug use: No  . Sexual activity: Yes    Birth control/ protection: Inserts   Other Topics Concern  . Not on file   Social History Narrative  . No narrative on file     Vitals:   08/09/17 1542  BP: (!) 122/92  Pulse: 99  SpO2: 99%  Weight: 196 lb (88.9 kg)  Height:  (1.778 m)    Wt Readings from Last 3 Encounters:  08/09/17 196 lb (88.9 kg)  08/03/17 193 lb 12.8 oz (87.9 kg)  06/01/17 191 lb (86.6 kg)     PHYSICAL EXAM General: NAD HEENT: Normal. Neck: No JVD, no thyromegaly. Lungs: Clear to auscultation bilaterally with normal respiratory effort. CV: Nondisplaced PMI.  Regular rate  and rhythm, normal S1/S2, no S3/S4, no murmur. No pretibial or periankle edema.  Abdomen: Soft, nontender, no distention.  Neurologic: Alert and oriented.  Psych: Normal affect. Skin: Normal. Musculoskeletal: No gross deformities.    ECG: Most recent ECG reviewed.   Labs: Lab Results  Component Value Date/Time   K 3.8 05/15/2017 11:52 AM   BUN 12 05/15/2017 11:52 AM   CREATININE 0.79 05/15/2017 11:52 AM   ALT 17 05/15/2017 11:52 AM   TSH 2.135 03/30/2007 09:42 PM   HGB 13.4 05/15/2017 11:52 AM     Lipids: No results found for: LDLCALC, LDLDIRECT,  CHOL, TRIG, HDL     ASSESSMENT AND PLAN:  1. Palpitations with paroxysmal supraventricular tachycardia: Event monitoring and echocardiogram reviewed above. She has not tolerated atenolol, metoprolol, oral diltiazem. She tells me that her symptoms correlate at about the time she has to change her vaginal ring containing etonogestrel-ethinyl estradiol. She spoke with her gynecologist about this but the patient tells me her gynecologist said that the vaginal ring should apply a steady state of hormone replacement. The patient questions this reasoning because the ring has to be replaced every month.  I talked to her about an EP study with possible ablation. An EP referral has already been placed and she is scheduled to see Dr. Johney Frame on 08/11/17. I will defer to him for management.  2. Mildly elevated diastolic BP: This has now been seen on 2 office visits. She was intolerant of atenolol, metoprolol, and diltiazem for PSVT. I would recommend possibly trying low-dose lisinopril 2.5 mg daily.  3. Chronic fatigue: Symptoms have improved to some degree. Both vitamin D and B-12 level were mildly low. She is now on replacement therapy. Will defer to PCP for management.      Disposition: Follow up with EP as scheduled. Follow up with me as needed.   Prentice Docker, M.D., F.A.C.C.

## 2017-08-10 ENCOUNTER — Ambulatory Visit (HOSPITAL_COMMUNITY): Admission: RE | Admit: 2017-08-10 | Payer: PRIVATE HEALTH INSURANCE | Source: Ambulatory Visit

## 2017-08-11 ENCOUNTER — Encounter: Payer: Self-pay | Admitting: Internal Medicine

## 2017-08-11 ENCOUNTER — Ambulatory Visit (INDEPENDENT_AMBULATORY_CARE_PROVIDER_SITE_OTHER): Payer: PRIVATE HEALTH INSURANCE | Admitting: Internal Medicine

## 2017-08-11 VITALS — BP 118/66 | HR 91 | Ht 70.0 in | Wt 191.6 lb

## 2017-08-11 DIAGNOSIS — R002 Palpitations: Secondary | ICD-10-CM

## 2017-08-11 DIAGNOSIS — I471 Supraventricular tachycardia: Secondary | ICD-10-CM | POA: Diagnosis not present

## 2017-08-11 DIAGNOSIS — F419 Anxiety disorder, unspecified: Secondary | ICD-10-CM | POA: Diagnosis not present

## 2017-08-11 NOTE — Progress Notes (Signed)
Electrophysiology Office Note   Date:  08/11/2017   ID:  LYNNETTA Johns, DOB May 29, 1981, MRN [id number]  PCP:  Assunta Found, MD  Cardiologist:  Dr Purvis Sheffield Primary Electrophysiologist: Hillis Range, MD    CC: tachycardia   History of Present Illness: Cynthia Johns is a 36 y.o. female who presents today for electrophysiology evaluation.   She is referred by Dr Purvis Sheffield for evaluation of SVT.  She reports having significant anxiety and frequently has sinus tachycardia as well as PVCs.  She has also had abrupt onset/ offset tachycardia which has been documented to be SVT.  She reports that her first episode occurred 6 years ago while pregnant.  She was evaluated at [facility] at that time and started on metoprolol which she did not tolerate due to fatigue.  She did well without further SVT until 05/15/17 when she awoke from sleep with tachypalpitations.  More recently, she had an episode of SVT 06/08/17 for which she was evaluated and found to have SVT.  She was instructed to use vagal maneuvers and her arrhythmia terminated.  She has tried atenolol but did not tolerate this due to fatigue.  She found that she has increased palpitations (PVCs) with diltiazem.  She is fearful of further SVT and wishes to have definitive therapy.  Today, she denies symptoms of  chest pain, shortness of breath, orthopnea, PND, lower extremity edema, claudication, dizziness, presyncope, syncope, bleeding, or neurologic sequela. The patient is tolerating medications without difficulties and is otherwise without complaint today.    Past Medical History:  Diagnosis Date  . Anxiety   . Overweight   . Sinus tachycardia   . SVT (supraventricular tachycardia) (HCC)    Past Surgical History:  Procedure Laterality Date  . BREAST ENHANCEMENT SURGERY    . CHOLECYSTECTOMY    . TONSILLECTOMY       Current Outpatient Prescriptions  Medication Sig Dispense Refill  . buPROPion (WELLBUTRIN XL) 300 MG 24 hr tablet Take 300  mg by mouth daily.  2  . Butalbital-Acetaminophen (BUTALBITAL-APAP) 50-325 MG TABS Take by mouth. And  caffeine    . cyanocobalamin (,VITAMIN B-12,) 1000 MCG/ML injection Inject 1 mL (1,000 mcg total) into the muscle every 30 (thirty) days.    Marland Kitchen dicyclomine (BENTYL) 10 MG capsule Take 1 capsule (10 mg total) by mouth 3 (three) times daily before meals. 90 capsule 0  . NUVARING 0.12-0.015 MG/24HR vaginal ring INSERT AND LEAVE VAGINALLY EVERY WEEK CONTINUOUSLY FOR 3 MONTHS, THEN OUT FOR ONE WEEK  1  . ondansetron (ZOFRAN) 4 MG tablet Take 4 mg by mouth every 8 (eight) hours as needed for nausea or vomiting.    . pantoprazole (PROTONIX) 40 MG tablet Take 1 tablet (40 mg total) by mouth daily. 30 tablet 3  . Vitamin D, Ergocalciferol, (DRISDOL) 50000 units CAPS capsule Take 1 capsule (50,000 Units total) by mouth every 7 (seven) days.     No current facility-administered medications for this visit.     Allergies:   Patient has no known allergies.   Social History:  The patient  reports that she has never smoked. She has never used smokeless tobacco. She reports that she does not drink alcohol or use drugs.   Family History:  The patient's  family history includes Hypertension in her mother.    ROS:  Please see the history of present illness.   All other systems are personally reviewed and negative.    PHYSICAL EXAM: VS:  BP 118/66  Pulse 91   Ht  (1.778 m)   Wt 191 lb 9.6 oz (86.9 kg)   SpO2 99%   BMI 27.49 kg/m  , BMI Body mass index is 27.49 kg/m. GEN: Well nourished, well developed, in no acute distress  HEENT: normal  Neck: no JVD, carotid bruits, or masses Cardiac: RRR; no murmurs, rubs, or gallops,no edema  Respiratory:  clear to auscultation bilaterally, normal work of breathing GI: soft, nontender, nondistended, + BS MS: no deformity or atrophy  Skin: warm and dry  Neuro:  Strength and sensation are intact Psych: very anxious  Recent Labs: 05/15/2017: ALT 17;  BUN 12; Creatinine, Ser 0.79; Hemoglobin 13.4; Platelets 276; Potassium 3.8; Sodium 137  personally reviewed   Lipid Panel  No results found for: CHOL, TRIG, HDL, CHOLHDL, VLDL, LDLCALC, LDLDIRECT personally reviewed   Wt Readings from Last 3 Encounters:  08/11/17 191 lb 9.6 oz (86.9 kg)  08/09/17 196 lb (88.9 kg)  08/03/17 193 lb 12.8 oz (87.9 kg)      Other studies personally reviewed: Additional studies/ records that were reviewed today include: Dr Junius Argyle notes, prior ekgs, event monitor  Review of the above records today demonstrates: as above   ASSESSMENT AND PLAN:  1.  SVT The patient has documented short RP SVT (event monitor 7/18).  She has failed medical therapy with metoprolol, atenolol, and diltiazem. Therapeutic strategies for afib including medicine and ablation were discussed in detail with the patient today. Risk, benefits, and alternatives to EP study and radiofrequency ablation for afib were also discussed in detail today. These risks include but are not limited to stroke, bleeding, vascular damage, tamponade, perforation, damage to the esophagus, lungs, and other structures, pulmonary vein stenosis, worsening renal function, and death. The patient understands these risk and wishes to proceed.  We will therefore proceed with catheter ablation once the patient has been adequately anticoagulated.  carto and anesthesia are requested for this procedure.  2. Anxiety She will need to discuss this with PCP again.  She has sinus tachycardia due to her anxiety which I have advised will not improve with ablation.  3. Sinus tachycardia Very bothersome to her She does not tolerate beta blockers or calcium channel blockers Lifestyle modification including stress reduction technique, regular daily exercise, and yoga were discussed as options    Current medicines are reviewed at length with the patient today.   The patient does not have concerns regarding her medicines.   The following changes were made today:  none  Labs/ tests ordered today include:  Orders Placed This Encounter  Procedures  . Basic metabolic panel  . CBC with Differential     Signed, Hillis Range, MD  08/11/2017 11:49 AM     [facility] [address] [place] [address] [phone] (office) [phone] (fax)

## 2017-08-11 NOTE — Patient Instructions (Addendum)
Medication Instructions:  Your physician recommends that you continue on your current medications as directed. Please refer to the Current Medication list given to you today.   Labwork: Your physician recommends that you return for lab work in: 09/07/17  BMP/CBC---you do not have to fast---7:30am   Testing/Procedures: Your physician has recommended that you have an ablation. Catheter ablation is a medical procedure used to treat some cardiac arrhythmias (irregular heartbeats). During catheter ablation, a long, thin, flexible tube is put into a blood vessel in your groin (upper thigh), or neck. This tube is called an ablation catheter. It is then guided to your heart through the blood vessel. Radio frequency waves destroy small areas of heart tissue where abnormal heartbeats may cause an arrhythmia to start. Please see the instruction sheet given to you today---09/12/17  Please arrive at [facility]tr[facility] of [facility] at 5:30am Do not eat or drink after midnight the night prior to the procedure Do not take any medications the morning of the test Plan for one night stay Will need someone to drive you home at discharge   Cardiac Ablation Cardiac ablation is a procedure to disable (ablate) a small amount of heart tissue in very specific places. The heart has many electrical connections. Sometimes these connections are abnormal and can cause the heart to beat very fast or irregularly. Ablating some of the problem areas can improve the heart rhythm or return it to normal. Ablation may be done for people who:  Have Wolff-Parkinson-White syndrome.  Have fast heart rhythms (tachycardia).  Have taken medicines for an abnormal heart rhythm (arrhythmia) that were not effective or caused side effects.  Have a high-risk heartbeat that may be life-threatening.  During the procedure, a small incision is made in the neck or the groin, and a long, thin, flexible tube (catheter) is  inserted into the incision and moved to the heart. Small devices (electrodes) on the tip of the catheter will send out electrical currents. A type of X-ray (fluoroscopy) will be used to help guide the catheter and to provide images of the heart. Tell a health care provider about:  Any allergies you have.  All medicines you are taking, including vitamins, herbs, eye drops, creams, and over-the-counter medicines.  Any problems you or family members have had with anesthetic medicines.  Any blood disorders you have.  Any surgeries you have had.  Any medical conditions you have, such as kidney failure.  Whether you are pregnant or may be pregnant. What are the risks? Generally, this is a safe procedure. However, problems may occur, including:  Infection.  Bruising and bleeding at the catheter insertion site.  Bleeding into the chest, especially into the sac that surrounds the heart. This is a serious complication.  Stroke or blood clots.  Damage to other structures or organs.  Allergic reaction to medicines or dyes.  Need for a permanent pacemaker if the normal electrical system is damaged. A pacemaker is a small computer that sends electrical signals to the heart and helps your heart beat normally.  The procedure not being fully effective. This may not be recognized until months later. Repeat ablation procedures are sometimes required.  What happens before the procedure?  Follow instructions from your health care provider about eating or drinking restrictions.  Ask your health care provider about: ? Changing or stopping your regular medicines. This is especially important if you are taking diabetes medicines or blood thinners. ? Taking medicines such as aspirin and  ibuprofen. These medicines can thin your blood. Do not take these medicines before your procedure if your health care provider instructs you not to.  Plan to have someone take you home from the hospital or  clinic.  If you will be going home right after the procedure, plan to have someone with you for 24 hours. What happens during the procedure?  To lower your risk of infection: ? Your health care team will wash or sanitize their hands. ? Your skin will be washed with soap. ? Hair may be removed from the incision area.  An IV tube will be inserted into one of your veins.  You will be given a medicine to help you relax (sedative).  The skin on your neck or groin will be numbed.  An incision will be made in your neck or your groin.  A needle will be inserted through the incision and into a large vein in your neck or groin.  A catheter will be inserted into the needle and moved to your heart.  Dye may be injected through the catheter to help your surgeon see the area of the heart that needs treatment.  Electrical currents will be sent from the catheter to ablate heart tissue in desired areas. There are three types of energy that may be used to ablate heart tissue: ? Heat (radiofrequency energy). ? Laser energy. ? Extreme cold (cryoablation).  When the necessary tissue has been ablated, the catheter will be removed.  Pressure will be held on the catheter insertion area to prevent excessive bleeding.  A bandage (dressing) will be placed over the catheter insertion area. The procedure may vary among health care providers and hospitals. What happens after the procedure?  Your blood pressure, heart rate, breathing rate, and blood oxygen level will be monitored until the medicines you were given have worn off.  Your catheter insertion area will be monitored for bleeding. You will need to lie still for a few hours to ensure that you do not bleed from the catheter insertion area.  Do not drive for 24 hours or as long as directed by your health care provider. Summary  Cardiac ablation is a procedure to disable (ablate) a small amount of heart tissue in very specific places. Ablating some  of the problem areas can improve the heart rhythm or return it to normal.  During the procedure, electrical currents will be sent from the catheter to ablate heart tissue in desired areas. This information is not intended to replace advice given to you by your health care provider. Make sure you discuss any questions you have with your health care provider. Document Released: 03/26/2009 Document Revised: 09/26/2016 Document Reviewed: 09/26/2016 Elsevier Interactive Patient Education  2018 [employer].     Follow-Up: Your physician recommends that you schedule a follow-up appointment in 4 weeks from 09/12/17 with Dr Johney Frame  Thank you for choosing [facility]!!     Dennis Bast, RN [phone]

## 2017-09-01 ENCOUNTER — Other Ambulatory Visit: Payer: Self-pay | Admitting: Family Medicine

## 2017-09-06 ENCOUNTER — Other Ambulatory Visit: Payer: PRIVATE HEALTH INSURANCE | Admitting: *Deleted

## 2017-09-06 DIAGNOSIS — I471 Supraventricular tachycardia: Secondary | ICD-10-CM

## 2017-09-06 LAB — CBC WITH DIFFERENTIAL/PLATELET
BASOS ABS: 0 10*3/uL (ref 0.0–0.2)
Basos: 1 %
EOS (ABSOLUTE): 0.2 10*3/uL (ref 0.0–0.4)
Eos: 4 %
HEMOGLOBIN: 11.9 g/dL (ref 11.1–15.9)
Hematocrit: 34.4 % (ref 34.0–46.6)
IMMATURE GRANS (ABS): 0 10*3/uL (ref 0.0–0.1)
Immature Granulocytes: 0 %
LYMPHS: 26 %
Lymphocytes Absolute: 1.7 10*3/uL (ref 0.7–3.1)
MCH: 31.6 pg (ref 26.6–33.0)
MCHC: 34.6 g/dL (ref 31.5–35.7)
MCV: 92 fL (ref 79–97)
MONOCYTES: 8 %
Monocytes Absolute: 0.5 10*3/uL (ref 0.1–0.9)
Neutrophils Absolute: 4 10*3/uL (ref 1.4–7.0)
Neutrophils: 61 %
Platelets: 311 10*3/uL (ref 150–379)
RBC: 3.76 x10E6/uL — AB (ref 3.77–5.28)
RDW: 12.4 % (ref 12.3–15.4)
WBC: 6.6 10*3/uL (ref 3.4–10.8)

## 2017-09-06 LAB — BASIC METABOLIC PANEL
BUN/Creatinine Ratio: 15 (ref 9–23)
BUN: 12 mg/dL (ref 6–20)
CALCIUM: 9.3 mg/dL (ref 8.7–10.2)
CHLORIDE: 103 mmol/L (ref 96–106)
CO2: 22 mmol/L (ref 20–29)
Creatinine, Ser: 0.79 mg/dL (ref 0.57–1.00)
GFR calc non Af Amer: 97 mL/min/{1.73_m2} (ref >59)
GFR, EST AFRICAN AMERICAN: 112 mL/min/{1.73_m2} (ref >59)
GLUCOSE: 80 mg/dL (ref 65–99)
Potassium: 4 mmol/L (ref 3.5–5.2)
Sodium: 142 mmol/L (ref 134–144)

## 2017-09-07 ENCOUNTER — Other Ambulatory Visit: Payer: PRIVATE HEALTH INSURANCE

## 2017-09-12 ENCOUNTER — Encounter (HOSPITAL_COMMUNITY)
Admission: RE | Disposition: A | Discharge status: Home / Self Care | Payer: Self-pay | Source: Ambulatory Visit | Attending: Internal Medicine

## 2017-09-12 ENCOUNTER — Encounter (HOSPITAL_COMMUNITY): Payer: Self-pay | Admitting: Certified Registered"

## 2017-09-12 ENCOUNTER — Ambulatory Visit (HOSPITAL_COMMUNITY)
Admission: RE | Admit: 2017-09-12 | Discharge: 2017-09-12 | Disposition: A | Discharge status: Home / Self Care | Payer: PRIVATE HEALTH INSURANCE | Source: Ambulatory Visit | Attending: Internal Medicine | Admitting: Internal Medicine

## 2017-09-12 ENCOUNTER — Ambulatory Visit (HOSPITAL_COMMUNITY): Payer: PRIVATE HEALTH INSURANCE | Admitting: Certified Registered"

## 2017-09-12 DIAGNOSIS — F329 Major depressive disorder, single episode, unspecified: Secondary | ICD-10-CM | POA: Diagnosis not present

## 2017-09-12 DIAGNOSIS — E663 Overweight: Secondary | ICD-10-CM | POA: Diagnosis not present

## 2017-09-12 DIAGNOSIS — I493 Ventricular premature depolarization: Secondary | ICD-10-CM | POA: Insufficient documentation

## 2017-09-12 DIAGNOSIS — Z6826 Body mass index (BMI) 26.0-26.9, adult: Secondary | ICD-10-CM | POA: Diagnosis not present

## 2017-09-12 DIAGNOSIS — F419 Anxiety disorder, unspecified: Secondary | ICD-10-CM | POA: Insufficient documentation

## 2017-09-12 DIAGNOSIS — K219 Gastro-esophageal reflux disease without esophagitis: Secondary | ICD-10-CM | POA: Insufficient documentation

## 2017-09-12 DIAGNOSIS — I471 Supraventricular tachycardia: Secondary | ICD-10-CM | POA: Insufficient documentation

## 2017-09-12 HISTORY — PX: SVT ABLATION: EP1225

## 2017-09-12 LAB — PREGNANCY, URINE: Preg Test, Ur: NEGATIVE

## 2017-09-12 SURGERY — SVT ABLATION
Anesthesia: Monitor Anesthesia Care

## 2017-09-12 MED ORDER — PROPOFOL 10 MG/ML IV BOLUS
INTRAVENOUS | Status: DC | PRN
  Administered 2017-09-12: 30 mg via INTRAVENOUS

## 2017-09-12 MED ORDER — SODIUM CHLORIDE 0.9% FLUSH: 3.0000 mL | Freq: Two times a day (BID) | INTRAVENOUS | Status: DC

## 2017-09-12 MED ORDER — PHENYLEPHRINE 40 MCG/ML (10ML) SYRINGE FOR IV PUSH (FOR BLOOD PRESSURE SUPPORT)
PREFILLED_SYRINGE | INTRAVENOUS | Status: DC | PRN
  Administered 2017-09-12 (×3): 120 ug via INTRAVENOUS

## 2017-09-12 MED ORDER — SODIUM CHLORIDE 0.9 % IV SOLN: 250.0000 mL | INTRAVENOUS | Status: DC | PRN

## 2017-09-12 MED ORDER — ONDANSETRON HCL 4 MG/2ML IJ SOLN
INTRAMUSCULAR | Status: DC | PRN
  Administered 2017-09-12: 4 mg via INTRAVENOUS

## 2017-09-12 MED ORDER — BUPIVACAINE HCL (PF) 0.25 % IJ SOLN
INTRAMUSCULAR | Status: DC | PRN
  Administered 2017-09-12: 30 mL

## 2017-09-12 MED ORDER — ISOPROTERENOL HCL 0.2 MG/ML IJ SOLN
INTRAMUSCULAR | Status: AC
  Filled 2017-09-12: qty 5

## 2017-09-12 MED ORDER — MIDAZOLAM HCL 2 MG/2ML IJ SOLN
INTRAMUSCULAR | Status: DC | PRN
  Administered 2017-09-12: 2 mg via INTRAVENOUS

## 2017-09-12 MED ORDER — HYDROCODONE-ACETAMINOPHEN 5-325 MG PO TABS: 1.0000 | ORAL_TABLET | ORAL | Status: DC | PRN

## 2017-09-12 MED ORDER — BUPIVACAINE HCL (PF) 0.25 % IJ SOLN
INTRAMUSCULAR | Status: AC
  Filled 2017-09-12: qty 30

## 2017-09-12 MED ORDER — FENTANYL CITRATE (PF) 100 MCG/2ML IJ SOLN
INTRAMUSCULAR | Status: DC | PRN
  Administered 2017-09-12 (×2): 50 ug via INTRAVENOUS

## 2017-09-12 MED ORDER — SODIUM CHLORIDE 0.9 % IV SOLN
INTRAVENOUS | Status: DC
  Administered 2017-09-12: 07:00:00 via INTRAVENOUS

## 2017-09-12 MED ORDER — ISOPROTERENOL HCL 0.2 MG/ML IJ SOLN
INTRAVENOUS | Status: DC | PRN
  Administered 2017-09-12: 2 ug/min via INTRAVENOUS

## 2017-09-12 MED ORDER — SODIUM CHLORIDE 0.9% FLUSH: 3.0000 mL | INTRAVENOUS | Status: DC | PRN

## 2017-09-12 MED ORDER — PROPOFOL 500 MG/50ML IV EMUL
INTRAVENOUS | Status: DC | PRN
  Administered 2017-09-12: 09:00:00 via INTRAVENOUS
  Administered 2017-09-12: 200 ug/kg/min via INTRAVENOUS

## 2017-09-12 SURGICAL SUPPLY — 13 items
BAG SNAP BAND KOVER 36X36 (MISCELLANEOUS) ×2 IMPLANT
BLANKET WARM UNDERBOD FULL ACC (MISCELLANEOUS) ×2 IMPLANT
CATH EZ STEER NAV 4MM D-F CUR (ABLATOR) ×1 IMPLANT
CATH JOSEPHSON QUAD-ALLRED 6FR (CATHETERS) ×2 IMPLANT
CATH WEBSTER BI DIR CS D-F CRV (CATHETERS) ×1 IMPLANT
PACK EP LATEX FREE (CUSTOM PROCEDURE TRAY) ×2
PACK EP LF (CUSTOM PROCEDURE TRAY) ×1 IMPLANT
PAD DEFIB LIFELINK (PAD) ×2 IMPLANT
PATCH CARTO3 (PAD) ×1 IMPLANT
SHEATH PINNACLE 6F 10CM (SHEATH) ×1 IMPLANT
SHEATH PINNACLE 7F 10CM (SHEATH) ×1 IMPLANT
SHEATH PINNACLE 8F 10CM (SHEATH) ×1 IMPLANT
SHIELD RADPAD SCOOP 12X17 (MISCELLANEOUS) ×2 IMPLANT

## 2017-09-12 NOTE — Transfer of Care (Signed)
Immediate Anesthesia Transfer of Care Note  Patient: Cynthia Johns  Procedure(s) Performed: SVT Ablation (N/A )  Patient Location: Cath Lab  Anesthesia Type:MAC  Level of Consciousness: awake, alert  and oriented  Airway & Oxygen Therapy: Patient Spontanous Breathing  Post-op Assessment: Report given to RN and Post -op Vital signs reviewed and stable  Post vital signs: Reviewed and stable  Last Vitals:  Vitals:   09/12/17 0548  BP: (!) 133/94  Pulse: 94  Resp: 18  Temp: 36.9 C  SpO2: 100%    Last Pain:  Vitals:   09/12/17 0921  TempSrc:   PainSc: 4          Complications: No apparent anesthesia complications

## 2017-09-12 NOTE — H&P (Signed)
CC: tachycardia   History of Present Illness: Cynthia Johns is a 36 y.o. female who presents today for electrophysiology study and ablation.  She has also had abrupt onset/ offset tachycardia which has been documented to be SVT.  She reports that her first episode occurred 6 years ago while pregnant.  She was evaluated at [facility] at that time and started on metoprolol which she did not tolerate due to fatigue.  She did well without further SVT until 05/15/17 when she awoke from sleep with tachypalpitations.  More recently, she had an episode of SVT 06/08/17 for which she was evaluated and found to have SVT.  She was instructed to use vagal maneuvers and her arrhythmia terminated.  She has tried atenolol but did not tolerate this due to fatigue.  She found that she has increased palpitations (PVCs) with diltiazem.  She is fearful of further SVT and wishes to have definitive therapy.  Today, she denies symptoms of  chest pain, shortness of breath, orthopnea, PND, lower extremity edema, claudication, dizziness, presyncope, syncope, bleeding, or neurologic sequela. The patient is tolerating medications without difficulties and is otherwise without complaint today.        Past Medical History:  Diagnosis Date  . Anxiety   . Overweight   . Sinus tachycardia   . SVT (supraventricular tachycardia) (HCC)         Past Surgical History:  Procedure Laterality Date  . BREAST ENHANCEMENT SURGERY    . CHOLECYSTECTOMY    . TONSILLECTOMY             Current Outpatient Prescriptions  Medication Sig Dispense Refill  . buPROPion (WELLBUTRIN XL) 300 MG 24 hr tablet Take 300 mg by mouth daily.  2  . Butalbital-Acetaminophen (BUTALBITAL-APAP) 50-325 MG TABS Take by mouth. And 40mg  caffeine    . cyanocobalamin (,VITAMIN B-12,) 1000 MCG/ML injection Inject 1 mL (1,000 mcg total) into the muscle every 30 (thirty) days.    Marland Kitchen dicyclomine (BENTYL) 10 MG capsule Take 1 capsule (10 mg total) by  mouth 3 (three) times daily before meals. 90 capsule 0  . NUVARING 0.12-0.015 MG/24HR vaginal ring INSERT AND LEAVE VAGINALLY EVERY WEEK CONTINUOUSLY FOR 3 MONTHS, THEN OUT FOR ONE WEEK  1  . ondansetron (ZOFRAN) 4 MG tablet Take 4 mg by mouth every 8 (eight) hours as needed for nausea or vomiting.    . pantoprazole (PROTONIX) 40 MG tablet Take 1 tablet (40 mg total) by mouth daily. 30 tablet 3  . Vitamin D, Ergocalciferol, (DRISDOL) 50000 units CAPS capsule Take 1 capsule (50,000 Units total) by mouth every 7 (seven) days.     No current facility-administered medications for this visit.     Allergies:   Patient has no known allergies.   Social History:  The patient  reports that she has never smoked. She has never used smokeless tobacco. She reports that she does not drink alcohol or use drugs.   Family History:  The patient's  family history includes Hypertension in her mother.    ROS:  Please see the history of present illness.   All other systems are personally reviewed and negative.    PHYSICAL EXAM: Vitals:   09/12/17 0548  BP: (!) 133/94  Pulse: 94  Resp: 18  Temp: 98.5 F (36.9 C)  SpO2: 100%   GEN: Well nourished, well developed, in no acute distress  HEENT: normal  Neck: no JVD, carotid bruits, or masses Cardiac: RRR; no murmurs, rubs, or gallops,no edema  Respiratory:  clear to auscultation bilaterally, normal work of breathing GI: soft, nontender, nondistended, + BS MS: no deformity or atrophy  Skin: warm and dry  Neuro:  Strength and sensation are intact Psych: very anxious  Recent Labs: Reviewed    ASSESSMENT AND PLAN:  1.  SVT The patient has documented short RP SVT (event monitor 7/18).  She has failed medical therapy with metoprolol, atenolol, and diltiazem. Therapeutic strategies for afib including medicine and ablation were discussed in detail with the patient today. Risk, benefits, and alternatives to EP study and radiofrequency  ablation for afib were also discussed in detail today. These risks include but are not limited to stroke, bleeding, vascular damage, tamponade, perforation, damage to the esophagus, lungs, and other structures, pulmonary vein stenosis, worsening renal function, and death. The patient understands these risk and wishes to proceed at this time.  2. Sinus tachycardia Very bothersome to her She does not tolerate beta blockers or calcium channel blockers Lifestyle modification including stress reduction technique, regular daily exercise, and yoga were discussed as options    Hillis RangeJames Keturah Yerby MD, [facility] 09/12/2017 7:24 AM

## 2017-09-12 NOTE — Anesthesia Procedure Notes (Signed)
Procedure Name: MAC Date/Time: 09/12/2017 7:41 AM Performed by: Imagene Riches Pre-anesthesia Checklist: Patient identified, Emergency Drugs available, Suction available and Patient being monitored Patient Re-evaluated:Patient Re-evaluated prior to induction Oxygen Delivery Method: Simple face mask Induction Type: IV induction Placement Confirmation: positive ETCO2 Dental Injury: Teeth and Oropharynx as per pre-operative assessment

## 2017-09-12 NOTE — Anesthesia Preprocedure Evaluation (Addendum)
Anesthesia Evaluation  Patient identified by MRN, date of birth, ID band Patient awake    Reviewed: Allergy & Precautions, NPO status , Patient's Chart, lab work & pertinent test results  Airway Mallampati: I  TM Distance: >3 FB Neck ROM: Full    Dental   Pulmonary    Pulmonary exam normal        Cardiovascular Normal cardiovascular exam     Neuro/Psych Anxiety Depression    GI/Hepatic GERD  Medicated and Controlled,  Endo/Other    Renal/GU      Musculoskeletal   Abdominal   Peds  Hematology   Anesthesia Other Findings   Reproductive/Obstetrics                             Anesthesia Physical Anesthesia Plan  ASA: III  Anesthesia Plan: MAC   Post-op Pain Management:    Induction: Intravenous  PONV Risk Score and Plan: 3 and Ondansetron, Dexamethasone, Midazolam and Treatment may vary due to age or medical condition  Airway Management Planned: Simple Face Mask  Additional Equipment:   Intra-op Plan:   Post-operative Plan:   Informed Consent: I have reviewed the patients History and Physical, chart, labs and discussed the procedure including the risks, benefits and alternatives for the proposed anesthesia with the patient or authorized representative who has indicated his/her understanding and acceptance.     Plan Discussed with: CRNA and Surgeon  Anesthesia Plan Comments:        Anesthesia Quick Evaluation

## 2017-09-12 NOTE — Anesthesia Postprocedure Evaluation (Signed)
Anesthesia Post Note  Patient: Cynthia Johns  Procedure(s) Performed: SVT Ablation (N/A )     Patient location during evaluation: PACU Anesthesia Type: MAC Level of consciousness: awake and alert Pain management: pain level controlled Vital Signs Assessment: post-procedure vital signs reviewed and stable Respiratory status: spontaneous breathing, nonlabored ventilation, respiratory function stable and patient connected to nasal cannula oxygen Cardiovascular status: stable and blood pressure returned to baseline Postop Assessment: no apparent nausea or vomiting Anesthetic complications: no    Last Vitals:  Vitals:   09/12/17 1100 09/12/17 1130  BP: 124/76 114/72  Pulse: 88 86  Resp: (!) 0 15  Temp:    SpO2: 98% 98%    Last Pain:  Vitals:   09/12/17 1028  TempSrc: Oral  PainSc:                  Kyley Solow DAVID

## 2017-09-12 NOTE — Discharge Instructions (Signed)

## 2017-09-12 NOTE — Progress Notes (Addendum)
Site area: RFV x 3 Site Prior to Removal:  Level 0 Pressure Applied For: 20 min Manual:   yes Patient Status During Pull: stable  Post Pull Site:  Level 0 Post Pull Instructions Given: yes  Post Pull Pulses Present: palpable Dressing Applied:  tegaderm Bedrest begins @ 1000 till 1400 Comments:

## 2017-10-02 ENCOUNTER — Encounter: Payer: Self-pay | Admitting: Internal Medicine

## 2017-10-11 ENCOUNTER — Ambulatory Visit: Payer: PRIVATE HEALTH INSURANCE | Admitting: Internal Medicine

## 2017-10-26 ENCOUNTER — Ambulatory Visit (INDEPENDENT_AMBULATORY_CARE_PROVIDER_SITE_OTHER): Payer: PRIVATE HEALTH INSURANCE | Admitting: Internal Medicine

## 2017-12-04 ENCOUNTER — Encounter: Payer: Self-pay | Admitting: Obstetrics & Gynecology

## 2017-12-04 ENCOUNTER — Ambulatory Visit: Payer: PRIVATE HEALTH INSURANCE | Admitting: Obstetrics & Gynecology

## 2017-12-04 VITALS — BP 130/80 | HR 96 | Ht 70.0 in | Wt 195.0 lb

## 2017-12-04 DIAGNOSIS — N946 Dysmenorrhea, unspecified: Secondary | ICD-10-CM

## 2017-12-04 DIAGNOSIS — F411 Generalized anxiety disorder: Secondary | ICD-10-CM

## 2017-12-04 MED ORDER — BUSPIRONE HCL 10 MG PO TABS: 10.0000 mg | ORAL_TABLET | Freq: Three times a day (TID) | ORAL | 11 refills | Status: DC

## 2017-12-04 NOTE — Progress Notes (Signed)
Chief Complaint  Patient presents with  . new gyn visit    talk about hormone/ has labs work with her          Chief Complaint  Patient presents with  . new gyn visit    talk about hormone/ has labs work with her      37 y.o. G1P1 No LMP recorded. The current method of family planning is NuvaRing vaginal inserts.  Outpatient Encounter Medications as of 12/04/2017  Medication Sig Note  . buPROPion (WELLBUTRIN XL) 300 MG 24 hr tablet Take 300 mg by mouth daily.   . cyanocobalamin (,VITAMIN B-12,) 1000 MCG/ML injection Inject 1 mL (1,000 mcg total) into the muscle every 30 (thirty) days.   Marland Kitchen. dicyclomine (BENTYL) 10 MG capsule Take 1 capsule (10 mg total) by mouth 3 (three) times daily before meals. (Patient taking differently: Take 10 mg by mouth 4 (four) times daily as needed for spasms. ) 09/12/2017: Never taken  . etonogestrel-ethinyl estradiol (NUVARING) 0.12-0.015 MG/24HR vaginal ring Place 1 each vaginally every 28 (twenty-eight) days. Insert vaginally and leave in place for 3 consecutive weeks, then remove for 1 week.   . pantoprazole (PROTONIX) 40 MG tablet Take 1 tablet (40 mg total) by mouth daily.   . Vitamin D, Ergocalciferol, (DRISDOL) 50000 units CAPS capsule Take 50,000 Units by mouth every Tuesday.    . busPIRone (BUSPAR) 10 MG tablet Take 1 tablet (10 mg total) by mouth 3 (three) times daily.   . naproxen sodium (ANAPROX) 220 MG tablet Take 440 mg by mouth 2 (two) times daily as needed (pain).   . Phenylephrine-Ibuprofen (ADVIL SINUS CONGESTION & PAIN) 10-200 MG TABS Take 1 tablet by mouth every 12 (twelve) hours as needed (congestion).    No facility-administered encounter medications on file as of 12/04/2017.     Subjective Cynthia Johns  Is concerned regarding her hormone status She is actually had some labs drawn and they are inconsistent with menopause She does have daily anxiety which negatively impacts her life She is also using NuvaRing which pretty  much avoids any hormonal concerns She does have SVT and had an ablation No suicidal thoughts or ideations This is not much of as the depression is just anxiety disorder Past Medical History:  Diagnosis Date  . Anxiety   . Overweight   . Sinus tachycardia   . SVT (supraventricular tachycardia) (HCC)     Past Surgical History:  Procedure Laterality Date  . BREAST ENHANCEMENT SURGERY    . CHOLECYSTECTOMY    . SVT ABLATION N/A 09/12/2017   Procedure: SVT Ablation;  Surgeon: Hillis RangeAllred, James, MD;  Location: [facility];  Service: Cardiovascular;  Laterality: N/A;  . TONSILLECTOMY      OB History    Gravida  1   Para  1   Term      Preterm      AB      Living        SAB      TAB      Ectopic      Multiple      Live Births              No Known Allergies  Social History   Socioeconomic History  . Marital status: Married    Spouse name: Not on file  . Number of children: Not on file  . Years of education: Not on file  . Highest education level: Not on file  Occupational  History  . Not on file  Social Needs  . Financial resource strain: Not on file  . Food insecurity:    Worry: Not on file    Inability: Not on file  . Transportation needs:    Medical: Not on file    Non-medical: Not on file  Tobacco Use  . Smoking status: Never Smoker  . Smokeless tobacco: Never Used  Substance and Sexual Activity  . Alcohol use: No  . Drug use: No  . Sexual activity: Yes    Birth control/protection: Inserts  Lifestyle  . Physical activity:    Days per week: Not on file    Minutes per session: Not on file  . Stress: Not on file  Relationships  . Social connections:    Talks on phone: Not on file    Gets together: Not on file    Attends religious service: Not on file    Active member of club or organization: Not on file    Attends meetings of clubs or organizations: Not on file    Relationship status: Not on file  Other Topics Concern  . Not on  file  Social History Narrative   Lives in [place] with spouse and daughter   Works at [employer] as a Associate Professor    Family History  Problem Relation Age of Onset  . Hypertension Mother     Medications:       Current Outpatient Medications:  .  buPROPion (WELLBUTRIN XL) 300 MG 24 hr tablet, Take 300 mg by mouth daily., Disp: , Rfl: 2 .  cyanocobalamin (,VITAMIN B-12,) 1000 MCG/ML injection, Inject 1 mL (1,000 mcg total) into the muscle every 30 (thirty) days., Disp: , Rfl:  .  dicyclomine (BENTYL) 10 MG capsule, Take 1 capsule (10 mg total) by mouth 3 (three) times daily before meals. (Patient taking differently: Take 10 mg by mouth 4 (four) times daily as needed for spasms. ), Disp: 90 capsule, Rfl: 0 .  etonogestrel-ethinyl estradiol (NUVARING) 0.12-0.015 MG/24HR vaginal ring, Place 1 each vaginally every 28 (twenty-eight) days. Insert vaginally and leave in place for 3 consecutive weeks, then remove for 1 week., Disp: , Rfl:  .  pantoprazole (PROTONIX) 40 MG tablet, Take 1 tablet (40 mg total) by mouth daily., Disp: 30 tablet, Rfl: 3 .  Vitamin D, Ergocalciferol, (DRISDOL) 50000 units CAPS capsule, Take 50,000 Units by mouth every Tuesday. , Disp: , Rfl:  .  busPIRone (BUSPAR) 10 MG tablet, Take 1 tablet (10 mg total) by mouth 3 (three) times daily., Disp: 90 tablet, Rfl: 11 .  naproxen sodium (ANAPROX) 220 MG tablet, Take 440 mg by mouth 2 (two) times daily as needed (pain)., Disp: , Rfl:  .  Phenylephrine-Ibuprofen (ADVIL SINUS CONGESTION & PAIN) 10-200 MG TABS, Take 1 tablet by mouth every 12 (twelve) hours as needed (congestion)., Disp: , Rfl:   Objective Blood pressure 130/80, pulse 96, height 5\' 10"  (1.778 m), weight 195 lb (88.5 kg).  General well-developed well-nourished female no distress  Pertinent ROS No burning with urination, frequency or urgency No nausea, vomiting or diarrhea Nor fever chills or other constitutional symptoms   Labs or studies Reviewed labs  that she brought with her    Impression Diagnoses this Encounter::   ICD-10-CM   1. Generalized anxiety disorder F41.1   2. Dysmenorrhea N94.6     Established relevant diagnosis(es):   Plan/Recommendations: Meds ordered this encounter  Medications  . busPIRone (BUSPAR) 10 MG tablet  Sig: Take 1 tablet (10 mg total) by mouth 3 (three) times daily.    Dispense:  90 tablet    Refill:  11    Labs or Scans Ordered: No orders of the defined types were placed in this encounter.   Management:: No evidence of menopause  does have daily anxiety so will start BuSpar  Follow up Return in about 6 weeks (around 01/15/2018) for Follow up, with Dr Despina Hidden.        Face to face time:  20 minutes  Greater than 50% of the visit time was spent in counseling and coordination of care with the patient.  The summary and outline of the counseling and care coordination is summarized in the note above.   All questions were answered.

## 2018-01-15 ENCOUNTER — Ambulatory Visit: Payer: PRIVATE HEALTH INSURANCE | Admitting: Obstetrics & Gynecology

## 2018-10-24 ENCOUNTER — Encounter (INDEPENDENT_AMBULATORY_CARE_PROVIDER_SITE_OTHER): Payer: Self-pay | Admitting: Internal Medicine

## 2018-10-24 ENCOUNTER — Ambulatory Visit (INDEPENDENT_AMBULATORY_CARE_PROVIDER_SITE_OTHER): Payer: PRIVATE HEALTH INSURANCE | Admitting: Internal Medicine

## 2018-10-24 DIAGNOSIS — R1010 Upper abdominal pain, unspecified: Secondary | ICD-10-CM | POA: Diagnosis not present

## 2018-10-24 NOTE — Progress Notes (Signed)
Subjective:    Patient ID: Cynthia Johns, female    DOB: 1981/02/13, 37 y.o.   MRN: [id number]  HPI  Presents today with c/o bloating and nausea. Last seen in September of 2018. She tells me today she has had nausea x 2 months. She says she stopped her thyroid med, but the nauseas did not stop. She says she has had diarrhea, mucous, lower abdominal pain, and epigastric. She says her abdominal pain was pretty bad this am. The epigastric pain comes and goes.  She says she can't eat or sleep. She feels bloated. She feels miserable. Saw Dr. Phillips OdorGolding 2 weeks ago and was given Lomotil and Zofran.  She says she took one Lomotil and became constipated x 1 week, then she had normal BM. Diarrhea started after that and she had urgency. She has had 2 loose stools today.  She has not seen any rectal bleeding The epigastric pain is the same as when she had gallbladder disease. No recent antibiotics. No family hx of Colon cancer. Family hx of Crohn's disease (maternal aunts). Hx of SVT and atrial fib.  Hx of cholecystitis and cholelithiasis and underwent a laparoscopic cholecystectomy in May of 2012 by Dr. Lovell SheehanJenkins.  Review of Systems Past Medical History:  Diagnosis Date  . Anxiety   . Overweight   . Sinus tachycardia   . SVT (supraventricular tachycardia) (HCC)     Past Surgical History:  Procedure Laterality Date  . BREAST ENHANCEMENT SURGERY    . CHOLECYSTECTOMY    . SVT ABLATION N/A 09/12/2017   Procedure: SVT Ablation;  Surgeon: Hillis RangeAllred, James, MD;  Location: [facility];  Service: Cardiovascular;  Laterality: N/A;  . TONSILLECTOMY      No Known Allergies  Current Outpatient Medications on File Prior to Visit  Medication Sig Dispense Refill  . cyanocobalamin (,VITAMIN B-12,) 1000 MCG/ML injection Inject 1 mL (1,000 mcg total) into the muscle every 30 (thirty) days.    Marland Kitchen. dicyclomine (BENTYL) 10 MG capsule Take 1 capsule (10 mg total) by mouth 3 (three) times daily before meals.  (Patient taking differently: Take 10 mg by mouth as needed for spasms. ) 90 capsule 0  . diphenoxylate-atropine (LOMOTIL) 2.5-0.025 MG tablet Take by mouth 4 (four) times daily as needed for diarrhea or loose stools.    Marland Kitchen. etonogestrel-ethinyl estradiol (NUVARING) 0.12-0.015 MG/24HR vaginal ring Place 1 each vaginally every 28 (twenty-eight) days. Insert vaginally and leave in place for 3 consecutive weeks, then remove for 1 week.    . ondansetron (ZOFRAN) 4 MG tablet Take 4 mg by mouth every 8 (eight) hours as needed for nausea or vomiting.    Marland Kitchen. Phenylephrine-Ibuprofen (ADVIL SINUS CONGESTION & PAIN) 10-200 MG TABS Take 1 tablet by mouth every 12 (twelve) hours as needed (congestion).    Marland Kitchen. thyroid (ARMOUR) 60 MG tablet Take 60 mg by mouth daily before breakfast.    . Vitamin D, Ergocalciferol, (DRISDOL) 50000 units CAPS capsule Take 50,000 Units by mouth every Tuesday.     . pantoprazole (PROTONIX) 40 MG tablet Take 1 tablet (40 mg total) by mouth daily. 30 tablet 3   No current facility-administered medications on file prior to visit.         Objective:   Physical Exam Blood pressure 138/82, pulse 84, temperature 98.4 F (36.9 C), height 5\' 10"  (1.778 m), weight 196 lb 9.6 oz (89.2 kg).  Alert and oriented. Skin warm and dry. Oral mucosa is moist.   . Sclera anicteric,  conjunctivae is pink. Thyroid not enlarged. No cervical lymphadenopathy. Lungs clear. Heart regular rate and rhythm.  Abdomen is soft. Bowel sounds are positive. No hepatomegaly. No abdominal masses felt. Some epigastric tenderness, slight lower abdominal tenderness.  No edema to lower extremities.          Assessment & Plan:  Epigastric tenderness, nausea, diarrhea. Am going to get a CBC, CMET, CT abdomen and pelvis with CM. Further recommendations to follow.

## 2018-10-24 NOTE — Patient Instructions (Addendum)
Labs, Stool studies and CT scan abdomen/pelvis.

## 2018-10-30 ENCOUNTER — Other Ambulatory Visit (INDEPENDENT_AMBULATORY_CARE_PROVIDER_SITE_OTHER): Payer: Self-pay | Admitting: Internal Medicine

## 2018-10-30 DIAGNOSIS — K582 Mixed irritable bowel syndrome: Secondary | ICD-10-CM

## 2018-10-31 LAB — COMPREHENSIVE METABOLIC PANEL
AG Ratio: 1.3 (calc) (ref 1.0–2.5)
ALT: 9 U/L (ref 6–29)
AST: 13 U/L (ref 10–30)
Albumin: 4 g/dL (ref 3.6–5.1)
Alkaline phosphatase (APISO): 45 U/L (ref 33–115)
BILIRUBIN TOTAL: 0.4 mg/dL (ref 0.2–1.2)
BUN: 12 mg/dL (ref 7–25)
CALCIUM: 9.4 mg/dL (ref 8.6–10.2)
CO2: 24 mmol/L (ref 20–32)
Chloride: 106 mmol/L (ref 98–110)
Creat: 0.75 mg/dL (ref 0.50–1.10)
Globulin: 3.2 g/dL (calc) (ref 1.9–3.7)
Glucose, Bld: 122 mg/dL (ref 65–139)
Potassium: 4.3 mmol/L (ref 3.5–5.3)
SODIUM: 140 mmol/L (ref 135–146)
TOTAL PROTEIN: 7.2 g/dL (ref 6.1–8.1)

## 2018-10-31 LAB — GASTROINTESTINAL PATHOGEN PANEL PCR
C. difficile Tox A/B, PCR: NOT DETECTED
CRYPTOSPORIDIUM, PCR: NOT DETECTED
Campylobacter, PCR: NOT DETECTED
E COLI (ETEC) LT/ST, PCR: NOT DETECTED
E COLI (STEC) STX1/STX2, PCR: NOT DETECTED
E coli 0157, PCR: NOT DETECTED
Giardia lamblia, PCR: NOT DETECTED
NOROVIRUS, PCR: NOT DETECTED
ROTAVIRUS, PCR: NOT DETECTED
SHIGELLA, PCR: NOT DETECTED
Salmonella, PCR: NOT DETECTED

## 2018-10-31 LAB — CBC
HEMATOCRIT: 36.4 % (ref 35.0–45.0)
Hemoglobin: 12.7 g/dL (ref 11.7–15.5)
MCH: 31.4 pg (ref 27.0–33.0)
MCHC: 34.9 g/dL (ref 32.0–36.0)
MCV: 89.9 fL (ref 80.0–100.0)
MPV: 9.3 fL (ref 7.5–12.5)
Platelets: 351 10*3/uL (ref 140–400)
RBC: 4.05 10*6/uL (ref 3.80–5.10)
RDW: 12.1 % (ref 11.0–15.0)
WBC: 7.7 10*3/uL (ref 3.8–10.8)

## 2018-10-31 LAB — SEDIMENTATION RATE: Sed Rate: 19 mm/h (ref 0–20)

## 2018-11-15 ENCOUNTER — Ambulatory Visit (HOSPITAL_COMMUNITY): Payer: PRIVATE HEALTH INSURANCE

## 2018-11-29 ENCOUNTER — Ambulatory Visit (HOSPITAL_COMMUNITY): Payer: PRIVATE HEALTH INSURANCE

## 2019-01-22 ENCOUNTER — Telehealth (INDEPENDENT_AMBULATORY_CARE_PROVIDER_SITE_OTHER): Payer: Self-pay | Admitting: Internal Medicine

## 2019-01-22 NOTE — Telephone Encounter (Signed)
Patient left message stating she is still having issues - please call her at [phone]

## 2019-01-23 NOTE — Telephone Encounter (Signed)
No answer. Will try again tomorrow.

## 2019-01-23 NOTE — Telephone Encounter (Signed)
Patient will take Dicyclomine one every other day and she will get a stool softener.   She becomes constipated if she takes the Dicyclomine daily. She has cancelled CT x 2 because of the expense. She will let me know how she is doing in a few weeks.

## 2019-01-23 NOTE — Telephone Encounter (Signed)
Patient called back

## 2019-03-04 ENCOUNTER — Encounter (INDEPENDENT_AMBULATORY_CARE_PROVIDER_SITE_OTHER): Payer: Self-pay

## 2019-03-05 ENCOUNTER — Other Ambulatory Visit: Payer: Self-pay

## 2019-03-05 ENCOUNTER — Ambulatory Visit (INDEPENDENT_AMBULATORY_CARE_PROVIDER_SITE_OTHER): Payer: PRIVATE HEALTH INSURANCE | Admitting: Internal Medicine

## 2019-03-05 ENCOUNTER — Encounter (INDEPENDENT_AMBULATORY_CARE_PROVIDER_SITE_OTHER): Payer: Self-pay | Admitting: Internal Medicine

## 2019-03-05 VITALS — BP 112/78 | HR 91 | Temp 98.3°F | Resp 18 | Ht 70.0 in | Wt 197.7 lb

## 2019-03-05 DIAGNOSIS — R110 Nausea: Secondary | ICD-10-CM | POA: Diagnosis not present

## 2019-03-05 DIAGNOSIS — R1013 Epigastric pain: Secondary | ICD-10-CM | POA: Diagnosis not present

## 2019-03-05 DIAGNOSIS — K580 Irritable bowel syndrome with diarrhea: Secondary | ICD-10-CM | POA: Diagnosis not present

## 2019-03-05 MED ORDER — ONDANSETRON HCL 4 MG PO TABS: 4.0000 mg | ORAL_TABLET | Freq: Three times a day (TID) | ORAL | 1 refills | Status: DC | PRN

## 2019-03-05 MED ORDER — PANTOPRAZOLE SODIUM 40 MG PO TBEC: 40.0000 mg | DELAYED_RELEASE_TABLET | Freq: Every day | ORAL | 3 refills | Status: DC

## 2019-03-05 MED ORDER — DICYCLOMINE HCL 20 MG PO TABS: 10.0000 mg | ORAL_TABLET | Freq: Every day | ORAL | 2 refills | Status: DC

## 2019-03-05 NOTE — Patient Instructions (Signed)
Keep pain and stool diary until office visit in 8 weeks. Hemoccult x1. Physician will call with results of blood test when copy received.

## 2019-03-05 NOTE — Progress Notes (Signed)
Presenting complaint;  Epigastric pain, nausea, LLQ abdominal pain and diarrhea.  History of present illness:  Patient is 38 year old Caucasian female who is being reevaluated as requested by Dr. Anastasio Champion for above symptoms. Patient was seen in the office by [place] NP on 10/24/2018 for epigastric pain nausea and diarrhea.  She had normal CBC comprehensive chemistry panel and GI pathogen panel.  Abdominal pelvic CT was planned but she decided not to have it because of high co-pay.  She has tried dicyclomine but she develops constipation.  She is presently not taking diphenoxylate.  Patient states she has been having epigastric pain for 1 year.  She has changed her eating habits she has quit drinking Dr. Malachi Bonds but she does not feel any better.  Pain occurs at different times and associated with nausea and gagging but no vomiting.  She has tried pantoprazole on as-needed basis but it does not help and so does Tums.  Pain at times is significant and may last all day.  She at times feels as if she has had a big meal even though she has not had any food.  She also occasionally wakes up in the middle of night just with pain and sometimes she wakes up in the morning.  She eats very little breakfast.  Previously she would eat breakfast every day usually at [employer] or restaurant.  She states she has heartburn an average of 3 times a week for which she takes Tums and it helps. She states her appetite is normal and this pain does not prevent her from eating.  She takes ondansetron about once a week.  She denies melena.  She also complains of intermittent pain in left lower quadrant of her abdomen this pain has been going on for 6 months and associated with diarrhea.  She generally has bowel movement within 20 minutes of her meal but not every time.  When she has diarrhea she has intense pain and cramping resulting in diaphoreses.  She has had severe urgency and multiple accidents.  She denies rectal bleeding or  weight loss.  She actually is quite disappointed that she has been watching her diet and on thyroid medication and has not lost any weight.  Her weight since her last visit of October 24, 2018 has increased by 1 pound only.  Patient states she takes dicyclomine 10 mg it helps with diarrhea but then she becomes constipated and may not have a bowel movement for 5 days and feels miserable.  She has had same problem with diphenoxylate which she is not taking. She is concerned about her symptoms because she has 2 maternal aunts(twins) who were diagnosed with Crohn's disease in their 61s or 36s. Patient reminds me that I saw her at [facility] in 2000 and she had multiple studies but she does remember details. She does not take OTC NSAIDs.  .   Current Medications: Outpatient Encounter Medications as of 03/05/2019  Medication Sig  . cyanocobalamin (,VITAMIN B-12,) 1000 MCG/ML injection Inject 1 mL (1,000 mcg total) into the muscle every 30 (thirty) days.  Marland Kitchen dicyclomine (BENTYL) 10 MG capsule Take 1 capsule (10 mg total) by mouth 3 (three) times daily before meals. (Patient taking differently: Take 10 mg by mouth as needed for spasms. )  . dicyclomine (BENTYL) 10 MG capsule TAKE ONE CAPSULE BY MOUTH FOUR TIMES DAILY (BEFORE MEALS AND AT BEDTIME)  . etonogestrel-ethinyl estradiol (NUVARING) 0.12-0.015 MG/24HR vaginal ring Place 1 each vaginally every 28 (twenty-eight) days. Insert vaginally and  leave in place for 3 consecutive weeks, then remove for 1 week.  . ondansetron (ZOFRAN) 4 MG tablet Take 4 mg by mouth every 8 (eight) hours as needed for nausea or vomiting.  . pantoprazole (PROTONIX) 40 MG tablet TAKE ONE TABLET BY MOUTH EVERY DAY  . Phenylephrine-Ibuprofen (ADVIL SINUS CONGESTION & PAIN) 10-200 MG TABS Take 1 tablet by mouth every 12 (twelve) hours as needed (congestion).  Marland Kitchen thyroid (ARMOUR) 60 MG tablet Take 60 mg by mouth daily before breakfast.  . Vitamin D, Ergocalciferol, (DRISDOL) 50000 units CAPS  capsule Take 50,000 Units by mouth every Tuesday.   . diphenoxylate-atropine (LOMOTIL) 2.5-0.025 MG tablet Take by mouth 4 (four) times daily as needed for diarrhea or loose stools.   No facility-administered encounter medications on file as of 03/05/2019.    Past medical history:  History of anxiety.  History of supraventricular tachycardia.  She had EP study in October 2018.  No accessory pathways were identified.  Inducible ectopic atrial tachycardia was not sustained and could not be mapped or ablated.  B12 deficiency.  Patient has not taken B12 shots in the last 6 months.  History of vitamin D deficiency.  Patient presumed to have hypothyroidism based on symptoms under the care of Dr. Anastasio Champion.  Tonsillectomy at age 42. Bilateral breast augmentation with saline implants in 2009. Cholelithiasis for symptomatic cholelithiasis in May 2012.   Allergies:  No Known Allergies  Family history:  Patient has no knowledge of father's health condition or contact with him. Mother is 52 years old in good health. She has 2 sisters ages 45 and 72 in good health. 2 maternal aunts(twins) who were diagnosed with Crohn's disease in their 32s or 74s and they both had surgery.  They are now 37 years old.  Social history:  Patient is married she has a daughter age 19 in good health except with constipation. 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  She has never smoked cigarettes and does not drink alcohol.  She stays active but does not do any regular physical activity.  Physical examination: Blood pressure 112/78, pulse 91, temperature 98.3 F (36.8 C), temperature source Oral, resp. rate 18, height _0  (1.778 m), weight 197 lb 11.2 oz (89.7 kg), last menstrual period 03/04/2019. Patient is alert and in no acute distress. Conjunctiva is pink. Sclera is nonicteric Oropharyngeal mucosa is  normal. No neck masses or thyromegaly noted. Cardiac exam with regular rhythm normal S1 and S2. No murmur or gallop noted. Lungs are clear to auscultation. Abdomen is full.  Bowel sounds are normal.  On palpation she has mild to moderate midepigastric tenderness without guarding or rebound.  She has mild tenderness across lower half of the abdomen without guarding or rebound.  No organomegaly or masses. No LE edema or clubbing noted.  Labs/studies Results:  CBC Latest Ref Rng & Units 10/24/2018 09/06/2017 05/15/2017  WBC 3.8 - 10.8 Thousand/uL 7.7 6.6 5.7  Hemoglobin 11.7 - 15.5 g/dL 12.7 11.9 13.4  Hematocrit 35.0 - 45.0 % 36.4 34.4 38.9  Platelets 140 - 400 Thousand/uL 351 311 276    CMP Latest Ref Rng & Units 10/24/2018 09/06/2017 05/15/2017  Glucose 65 - 139 mg/dL 122 80 79  BUN 7 - 25 mg/dL _1 Creatinine 0.50 - 1.10 mg/dL 0.75 0.79 0.79  Sodium 135 - 146 mmol/L 140 142 137  Potassium 3.5 - 5.3 mmol/L 4.3 4.0 3.8  Chloride 98 - 110 mmol/L 106 103 105  CO2 20 - 32 mmol/L _0 Calcium 8.6 - 10.2 mg/dL 9.4 9.3 9.2  Total Protein 6.1 - 8.1 g/dL 7.2 - 7.9  Total Bilirubin 0.2 - 1.2 mg/dL 0.4 - 0.6  Alkaline Phos 38 - 126 U/L - - 38  AST 10 - 30 U/L 13 - 18  ALT 6 - 29 U/L 9 - 17    Hepatic Function Latest Ref Rng & Units 10/24/2018 05/15/2017 04/07/2011  Total Protein 6.1 - 8.1 g/dL 7.2 7.9 7.9  Albumin 3.5 - 5.0 g/dL - 4.0 4.3  AST 10 - 30 U/L _1 ALT 6 - 29 U/L _2 Alk Phosphatase 38 - 126 U/L - 38 83  Total Bilirubin 0.2 - 1.2 mg/dL 0.4 0.6 0.3  Bilirubin, Direct 0.0 - 0.3 mg/dL - - 0.1    GI pathogen panel negative on 10/24/2018.  Assessment:  #1.  Epigastric pain.  Pain is primarily in mid epigastric region associated with nausea and heaving but no vomiting and she has not responded to as needed PPI.  No clear-cut relationship with meals or any other triggers.  She is status post cholecystectomy about 8 years ago.  Differential diagnosis includes chronic  gastritis peptic ulcer disease or dyspepsia.  #2.  LLQ abdominal pain associated with diarrhea most likely due to irritable bowel syndrome.  She appears to be very sensitive to dicyclomine.  She does not have any alarm symptoms.  Doubt inflammatory bowel disease.  Need to rule out celiac disease.   Plan:  Request records from 2000 evaluation at [facility] saw her then). Medication list updated.  Diphenoxylate deleted. Patient will go to the lab for serum B12, celiac disease panel as well as H. pylori IgG antibody. Hemoccult x1. Patient advised to take pantoprazole 40 mg by mouth 30 minutes before breakfast daily. Dicyclomine 5 mg by mouth 30 minutes before breakfast daily. Ondansetron 4 mg by mouth twice daily as needed. Patient also advised to increase dietary fiber intake as tolerated. Patient advised to keep pain and stool diary until next office visit in 8 weeks.

## 2019-03-11 ENCOUNTER — Encounter (INDEPENDENT_AMBULATORY_CARE_PROVIDER_SITE_OTHER): Payer: Self-pay

## 2019-03-19 LAB — CELIAC DISEASE PANEL
(tTG) Ab, IgA: 1 U/mL
(tTG) Ab, IgG: 2 U/mL
Gliadin IgA: 2 Units
Gliadin IgG: 2 Units
Immunoglobulin A: 248 mg/dL (ref 47–310)

## 2019-03-19 LAB — VITAMIN B12: Vitamin B-12: 176 pg/mL — ABNORMAL LOW (ref 200–1100)

## 2019-03-26 ENCOUNTER — Telehealth (INDEPENDENT_AMBULATORY_CARE_PROVIDER_SITE_OTHER): Payer: Self-pay | Admitting: *Deleted

## 2019-03-26 NOTE — Telephone Encounter (Signed)
   Diagnosis:    Result(s)   Card 1: Negative:          Completed by: Larose Hires ,LPN   HEMOCCULT SENSA DEVELOPER: LOT#:  [id number] EXPIRATION DATE: 2022-06   HEMOCCULT SENSA CARD:  LOT#:  [id number] EXPIRATION DATE: 03/20   CARD CONTROL RESULTS:  POSITIVE: Positive NEGATIVE: Negative    ADDITIONAL COMMENTS: Patient has been called and results given. Forwarded to Dr.Rehman .

## 2019-03-28 ENCOUNTER — Telehealth (INDEPENDENT_AMBULATORY_CARE_PROVIDER_SITE_OTHER): Payer: Self-pay | Admitting: *Deleted

## 2019-03-28 ENCOUNTER — Other Ambulatory Visit (INDEPENDENT_AMBULATORY_CARE_PROVIDER_SITE_OTHER): Payer: Self-pay | Admitting: *Deleted

## 2019-03-28 DIAGNOSIS — R1013 Epigastric pain: Secondary | ICD-10-CM

## 2019-03-28 DIAGNOSIS — R110 Nausea: Secondary | ICD-10-CM

## 2019-03-28 DIAGNOSIS — R1032 Left lower quadrant pain: Secondary | ICD-10-CM

## 2019-03-28 DIAGNOSIS — K5909 Other constipation: Secondary | ICD-10-CM

## 2019-03-28 NOTE — Telephone Encounter (Signed)
CT sch'd 04/02/19 at 200 (145), npo 4 hrs, pick up contrast, patient aware

## 2019-03-28 NOTE — Telephone Encounter (Signed)
Per NUR patient to have CT abd/pelvis with contrast.  He ask that the patient have this before April 22, 2019.

## 2019-03-28 NOTE — Telephone Encounter (Signed)
The order has been completed , a PA is pending. Patient will be made aware of the time.

## 2019-03-28 NOTE — Telephone Encounter (Signed)
Stool is guaiac negative. We will proceed with abdominal pelvic CT with contrast.

## 2019-04-02 ENCOUNTER — Ambulatory Visit (HOSPITAL_COMMUNITY)
Admission: RE | Admit: 2019-04-02 | Discharge: 2019-04-02 | Disposition: A | Discharge status: Home / Self Care | Payer: PRIVATE HEALTH INSURANCE | Source: Ambulatory Visit | Attending: Internal Medicine | Admitting: Internal Medicine

## 2019-04-02 ENCOUNTER — Other Ambulatory Visit: Payer: Self-pay

## 2019-04-02 DIAGNOSIS — R1013 Epigastric pain: Secondary | ICD-10-CM | POA: Insufficient documentation

## 2019-04-02 DIAGNOSIS — R110 Nausea: Secondary | ICD-10-CM | POA: Diagnosis present

## 2019-04-02 DIAGNOSIS — R1032 Left lower quadrant pain: Secondary | ICD-10-CM | POA: Insufficient documentation

## 2019-04-02 DIAGNOSIS — K5909 Other constipation: Secondary | ICD-10-CM | POA: Insufficient documentation

## 2019-04-02 MED ORDER — IOHEXOL 300 MG/ML  SOLN
100.0000 mL | Freq: Once | INTRAMUSCULAR | Status: AC | PRN
  Administered 2019-04-02: 15:00:00 100 mL via INTRAVENOUS

## 2019-04-03 ENCOUNTER — Telehealth (INDEPENDENT_AMBULATORY_CARE_PROVIDER_SITE_OTHER): Payer: Self-pay | Admitting: *Deleted

## 2019-04-03 ENCOUNTER — Other Ambulatory Visit (INDEPENDENT_AMBULATORY_CARE_PROVIDER_SITE_OTHER): Payer: Self-pay | Admitting: Internal Medicine

## 2019-04-03 MED ORDER — POLYETHYLENE GLYCOL 3350 17 GM/SCOOP PO POWD: 17.0000 g | Freq: Every day | ORAL | 5 refills | Status: DC

## 2019-04-03 NOTE — Telephone Encounter (Signed)
Patient called and states that she woke up this morning with a rash all over. She had a diagnostic procedure on 04/02/19.  Per Dr.Rehman call in Medrol Dose pack , use as directed and she may take Benadryl 25 mg daily along with it.  This was called to [facility] @ [facility] , and patient is aware.

## 2019-04-29 ENCOUNTER — Ambulatory Visit (INDEPENDENT_AMBULATORY_CARE_PROVIDER_SITE_OTHER): Payer: PRIVATE HEALTH INSURANCE | Admitting: Internal Medicine

## 2019-07-23 ENCOUNTER — Other Ambulatory Visit (INDEPENDENT_AMBULATORY_CARE_PROVIDER_SITE_OTHER): Payer: Self-pay | Admitting: Internal Medicine

## 2019-08-28 ENCOUNTER — Ambulatory Visit (INDEPENDENT_AMBULATORY_CARE_PROVIDER_SITE_OTHER): Payer: PRIVATE HEALTH INSURANCE | Admitting: Internal Medicine

## 2019-10-10 ENCOUNTER — Other Ambulatory Visit: Payer: Self-pay

## 2019-10-10 DIAGNOSIS — Z20822 Contact with and (suspected) exposure to covid-19: Secondary | ICD-10-CM

## 2019-10-13 ENCOUNTER — Telehealth: Payer: Self-pay

## 2019-10-13 LAB — NOVEL CORONAVIRUS, NAA: SARS-CoV-2, NAA: DETECTED — AB

## 2019-10-13 NOTE — Telephone Encounter (Signed)
Pt called for covid test results. Advised results are not back.  

## 2019-10-21 ENCOUNTER — Other Ambulatory Visit (INDEPENDENT_AMBULATORY_CARE_PROVIDER_SITE_OTHER): Payer: Self-pay | Admitting: Internal Medicine

## 2019-10-21 ENCOUNTER — Encounter (INDEPENDENT_AMBULATORY_CARE_PROVIDER_SITE_OTHER): Payer: Self-pay | Admitting: Internal Medicine

## 2019-10-21 MED ORDER — THYROID 30 MG PO TABS: 30.0000 mg | ORAL_TABLET | Freq: Every day | ORAL | 3 refills | Status: DC

## 2019-10-24 ENCOUNTER — Other Ambulatory Visit (INDEPENDENT_AMBULATORY_CARE_PROVIDER_SITE_OTHER): Payer: Self-pay | Admitting: Internal Medicine

## 2019-10-28 ENCOUNTER — Telehealth (INDEPENDENT_AMBULATORY_CARE_PROVIDER_SITE_OTHER): Payer: PRIVATE HEALTH INSURANCE | Admitting: Nurse Practitioner

## 2019-11-11 ENCOUNTER — Other Ambulatory Visit (INDEPENDENT_AMBULATORY_CARE_PROVIDER_SITE_OTHER): Payer: Self-pay | Admitting: Internal Medicine

## 2019-11-11 ENCOUNTER — Telehealth (INDEPENDENT_AMBULATORY_CARE_PROVIDER_SITE_OTHER): Payer: Self-pay

## 2019-11-11 NOTE — Telephone Encounter (Signed)
Pt will need a letter stating she has had annual physical and blood work for HR to file for insurance.

## 2019-11-18 ENCOUNTER — Other Ambulatory Visit (INDEPENDENT_AMBULATORY_CARE_PROVIDER_SITE_OTHER): Payer: Self-pay | Admitting: Internal Medicine

## 2020-06-03 ENCOUNTER — Other Ambulatory Visit (INDEPENDENT_AMBULATORY_CARE_PROVIDER_SITE_OTHER): Payer: Self-pay

## 2020-06-03 NOTE — Telephone Encounter (Signed)
We have not seen a me for more than 10 months in this office.  I will not refill any medication until she has made an appointment.

## 2020-06-04 ENCOUNTER — Other Ambulatory Visit: Payer: Self-pay

## 2020-06-04 ENCOUNTER — Encounter (INDEPENDENT_AMBULATORY_CARE_PROVIDER_SITE_OTHER): Payer: Self-pay | Admitting: Gastroenterology

## 2020-06-04 ENCOUNTER — Ambulatory Visit (INDEPENDENT_AMBULATORY_CARE_PROVIDER_SITE_OTHER): Payer: No Typology Code available for payment source | Admitting: Gastroenterology

## 2020-06-04 VITALS — BP 127/83 | HR 87 | Temp 98.7°F | Ht 70.0 in | Wt 196.9 lb

## 2020-06-04 DIAGNOSIS — R197 Diarrhea, unspecified: Secondary | ICD-10-CM | POA: Diagnosis not present

## 2020-06-04 DIAGNOSIS — R1084 Generalized abdominal pain: Secondary | ICD-10-CM | POA: Diagnosis not present

## 2020-06-04 MED ORDER — HYOSCYAMINE SULFATE SL 0.125 MG SL SUBL: 0.1250 mg | SUBLINGUAL_TABLET | Freq: Four times a day (QID) | SUBLINGUAL | 1 refills | Status: DC | PRN

## 2020-06-04 MED ORDER — ONDANSETRON 4 MG PO TBDP: 4.0000 mg | ORAL_TABLET | Freq: Three times a day (TID) | ORAL | 1 refills | Status: DC | PRN

## 2020-06-04 NOTE — Patient Instructions (Signed)
Benefiber or Citrucel fiber supplement  Probiotics: [employer] or Align  Use levsin as needed - particularly if errands, etc after meals  Decrease carbonation as discussed.

## 2020-06-04 NOTE — Progress Notes (Signed)
Patient profile: Jariyah SANAIA JASSO is a 39 y.o. female seen for evaluation of 02/2019 for epigastric pain and diarrhea   History of Present Illness: Neilah K Baum is seen today for she reports epigastric pain, can be nauseated/cramping feeling, ongoing x years, reports eating doesn't seem to affect pain + early satiety. No vomiting. Has tried pantoprazole for GERD without significant improvement in epigastric pain, she feels famotidine helps with the pain more than Protonix.    She reports a chronic history of diarrhea and abdominal cramping which has worsened recently. She has tried bentyl for abd cramping - this caused constipation even at 2.5mg  dose.  Report she has stopped eating out due to urgency after eating w/ stooling. This can also occur after foods such as cooking at home w/ bacon. When has urgency has liquid stool usually. Feels sometimes urgency cna be so severe had incontinence in public. No blood in stool.  Diarrhea and abdominal cramping tends to occur in waves - can go on for weeks then intermittently resolves with even just one dose of imdoium can cause constipation (up to 5-7 days without BM).  She takes Aleve 2-3x/week w/ food.  Drinks 124 ounce and one 16 ounce [employer] daily.  She denies any water intake.  Reports she only drinks soda.  She does not smoke.  No frequent alcohol.   Wt Readings from Last 3 Encounters:  06/04/20 196 lb 14.4 oz (89.3 kg)  03/05/19 197 lb 11.2 oz (89.7 kg)  10/24/18 196 lb 9.6 oz (89.2 kg)     Last Colonoscopy: None prior Last Endoscopy: None prior   Past Medical History:  Past Medical History:  Diagnosis Date   Anxiety    Overweight    Sinus tachycardia    SVT (supraventricular tachycardia) (HCC)     Problem List: Patient Active Problem List   Diagnosis Date Noted   ANXIETY 03/30/2007   DEPRESSION 03/30/2007   GERD 03/30/2007   CONSTIPATION, CHRONIC 03/30/2007   IBS 03/30/2007   WEIGHT GAIN, ABNORMAL 03/30/2007      Past Surgical History: Past Surgical History:  Procedure Laterality Date   BREAST ENHANCEMENT SURGERY     CHOLECYSTECTOMY     SVT ABLATION N/A 09/12/2017   Procedure: SVT Ablation;  Surgeon: Hillis Range, MD;  Location: [facility] INVASIVE [facility];  Service: Cardiovascular;  Laterality: N/A;   TONSILLECTOMY      Allergies: Allergies  Allergen Reactions   Contrast Media [Iodinated Diagnostic Agents] Rash      Home Medications:  Current Outpatient Medications:    Cholecalciferol (VITAMIN D3) 1.25 MG (50000 UT) CAPS, TAKE ONE CAPSULE BY MOUTH ONCE A WEEK., Disp: 4 capsule, Rfl: 2   ondansetron (ZOFRAN) 4 MG tablet, TAKE ONE TABLET BY MOUTH EVERY 8 HOURS AS NEEDED FOR NAUSEA OR VOMITING., Disp: 20 tablet, Rfl: 1   Vitamin D, Ergocalciferol, (DRISDOL) 50000 units CAPS capsule, Take 50,000 Units by mouth every Tuesday. , Disp: , Rfl:    cyanocobalamin (,VITAMIN B-12,) 1000 MCG/ML injection, Inject 1 mL (1,000 mcg total) into the muscle every 30 (thirty) days. (Patient not taking: Reported on 06/04/2020), Disp: , Rfl:    dicyclomine (BENTYL) 20 MG tablet, Take 0.5 tablets (10 mg total) by mouth daily before breakfast. (Patient not taking: Reported on 06/04/2020), Disp: 30 tablet, Rfl: 2   etonogestrel-ethinyl estradiol (NUVARING) 0.12-0.015 MG/24HR vaginal ring, Place 1 each vaginally every 28 (twenty-eight) days. Insert vaginally and leave in place for 3 consecutive weeks, then remove for 1  week. (Patient not taking: Reported on 06/04/2020), Disp: , Rfl:    Hyoscyamine Sulfate SL (LEVSIN/SL) 0.125 MG SUBL, Place 0.125 mg under the tongue 4 (four) times daily as needed. Abd cramping, diarrhea, Disp: 60 tablet, Rfl: 1   NP THYROID 90 MG tablet, TAKE ONE TABLET BY MOUTH DAILY. (Patient not taking: Reported on 06/04/2020), Disp: 30 tablet, Rfl: 1   ondansetron (ZOFRAN ODT) 4 MG disintegrating tablet, Take 1 tablet (4 mg total) by mouth every 8 (eight) hours as needed for nausea or vomiting.,  Disp: 30 tablet, Rfl: 1   pantoprazole (PROTONIX) 40 MG tablet, Take 1 tablet (40 mg total) by mouth daily before breakfast. (Patient not taking: Reported on 06/04/2020), Disp: 30 tablet, Rfl: 3   Phenylephrine-Ibuprofen (ADVIL SINUS CONGESTION & PAIN) 10-200 MG TABS, Take 1 tablet by mouth every 12 (twelve) hours as needed (congestion). (Patient not taking: Reported on 06/04/2020), Disp: , Rfl:    polyethylene glycol powder (GLYCOLAX/MIRALAX) 17 GM/SCOOP powder, Take 17 g by mouth daily. (Patient not taking: Reported on 06/04/2020), Disp: 507 g, Rfl: 5   thyroid (NP THYROID) 30 MG tablet, Take 1 tablet (30 mg total) by mouth daily before breakfast. (Patient not taking: Reported on 06/04/2020), Disp: 30 tablet, Rfl: 3   Family History: family history includes Hypertension in her mother.    Social History:   reports that she has never smoked. She has never used smokeless tobacco. She reports that she does not drink alcohol and does not use drugs.   Review of Systems: Constitutional: Denies weight loss/weight gain  Eyes: No changes in vision. ENT: No oral lesions, sore throat.  GI: see HPI.  Heme/Lymph: No easy bruising.  CV: No chest pain.  GU: No hematuria.  Integumentary: No rashes.  Neuro: No headaches.  Psych: No depression/anxiety.  Endocrine: No heat/cold intolerance.  Allergic/Immunologic: No urticaria.  Resp: No cough, SOB.  Musculoskeletal: No joint swelling.    Physical Examination: BP 127/83 (BP Location: Right Arm, Patient Position: Sitting, Cuff Size: Normal)    Pulse 87    Temp 98.7 F (37.1 C) (Oral)    Ht 5\' 10"  (1.778 m)    Wt 196 lb 14.4 oz (89.3 kg)    LMP 05/26/2020    BMI 28.25 kg/m  Gen: NAD, alert and oriented x 4 HEENT: PEERLA, EOMI, Neck: supple, no JVD Chest: CTA bilaterally, no wheezes, crackles, or other adventitious sounds CV: RRR, no m/g/c/r Abd: soft, NT, ND, +BS in all four quadrants; no HSM, guarding, ridigity, or rebound tenderness Ext: no edema,  well perfused with 2+ pulses, Skin: no rash or lesions noted on observed skin Lymph: no noted LAD  Data Reviewed:  02/2019-celiac negative, B12 at 176,    Assessment/Plan: Ms. Palmisano is a 39 y.o. female   1.  Diarrhea/abdominal cramping-has had constipation even with low-dose Bentyl, she is interested in trying Levsin to see if will improve cramping without rebound constipation.  We will try Levsin as needed before errands, car rides, etc. to decrease incontinence issues.  Celiac panel negative.  Alternates with occasional constipation so likely irritable bowel.  We discussed fiber or probiotic as well as decreasing carbonation and other food triggers.  If symptoms continue would consider further evaluation with colonoscopy etc. which has not been completed in the past secondary to cost.  2.  Epigastric pain/nausea-resolved with as needed famotidine which she will continue.  Did not feel improvement with PPI.  Avoid diet triggers. Has zofran to use PRN for nausea  She would like to follow-up as needed based on symptom response.  I asked her to send me a progress report in 1 to 2 weeks via MyChart  Zeinab was seen today for abdominal pain and diarrhea.  Diagnoses and all orders for this visit:  Diffuse abdominal pain  Diarrhea, unspecified type  Other orders -     Hyoscyamine Sulfate SL (LEVSIN/SL) 0.125 MG SUBL; Place 0.125 mg under the tongue 4 (four) times daily as needed. Abd cramping, diarrhea -     ondansetron (ZOFRAN ODT) 4 MG disintegrating tablet; Take 1 tablet (4 mg total) by mouth every 8 (eight) hours as needed for nausea or vomiting.       I personally performed the service, non-incident to. (WP)  Tawni Pummel, [facility]

## 2020-07-20 ENCOUNTER — Ambulatory Visit (INDEPENDENT_AMBULATORY_CARE_PROVIDER_SITE_OTHER): Payer: PRIVATE HEALTH INSURANCE | Admitting: Gastroenterology

## 2020-07-26 IMAGING — CT CT ABDOMEN AND PELVIS WITH CONTRAST
2 of 4 series · 15 of 46 positions shown, 17 images · IV contrast (Isovue)
Comparison: January 18, 2008

CLINICAL DATA: Epigastric pain with nausea

EXAM:
CT ABDOMEN AND PELVIS WITH CONTRAST
TECHNIQUE: Multidetector CT imaging of the abdomen and pelvis was performed
using the standard protocol following bolus administration of
intravenous contrast. Oral contrast was also administered.
CONTRAST:  100mL OMNIPAQUE IOHEXOL 300 MG/ML  SOLN

[Series 2: axial st · axial · 0.90mm/px · z∈[-520,-55]mm · 12 of 105 slices shown, 14 images]
[im 6/105  soft-tissue]
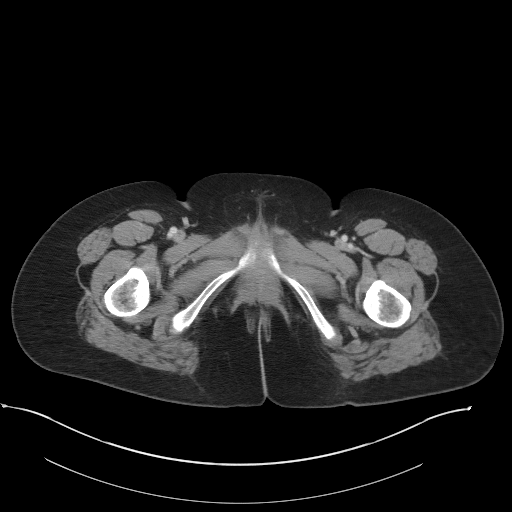
[im 6/105  bone]
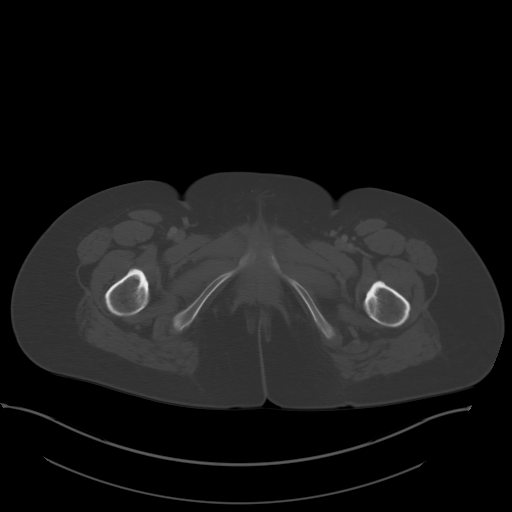
[im 18/105  soft-tissue]
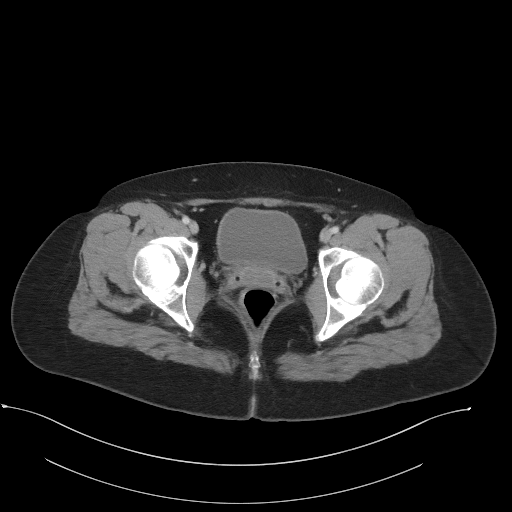
[im 24/105  soft-tissue]
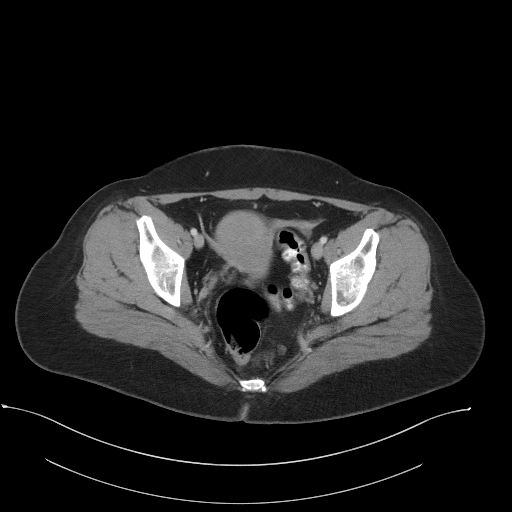
[im 29/105  soft-tissue]
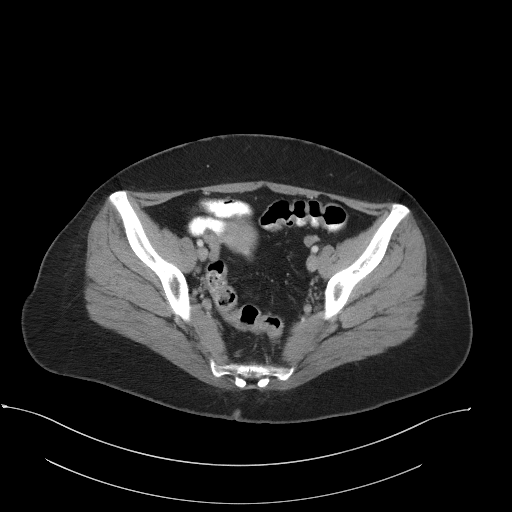
[im 41/105  soft-tissue]
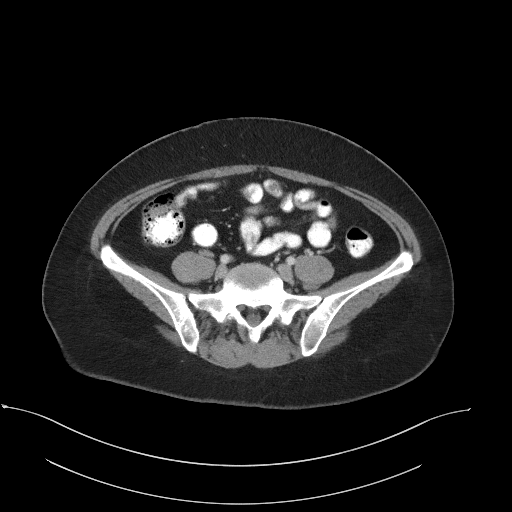
[im 47/105  soft-tissue]
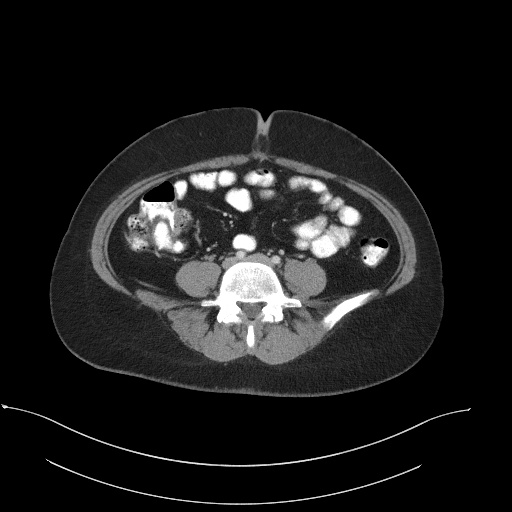
[im 58/105  soft-tissue]
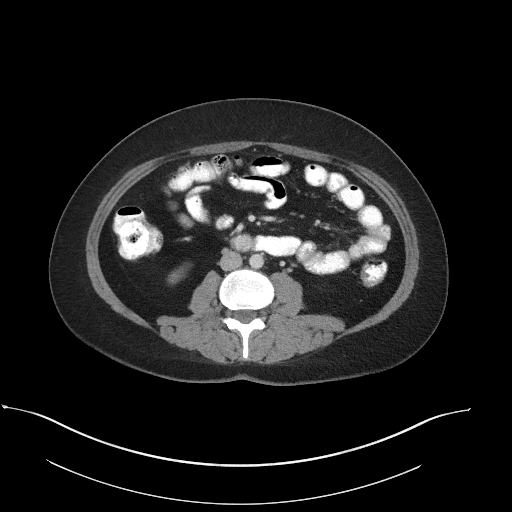
[im 64/105  soft-tissue]
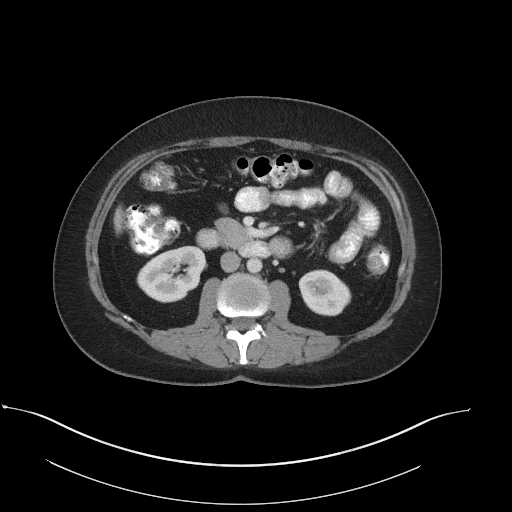
[im 76/105  soft-tissue]
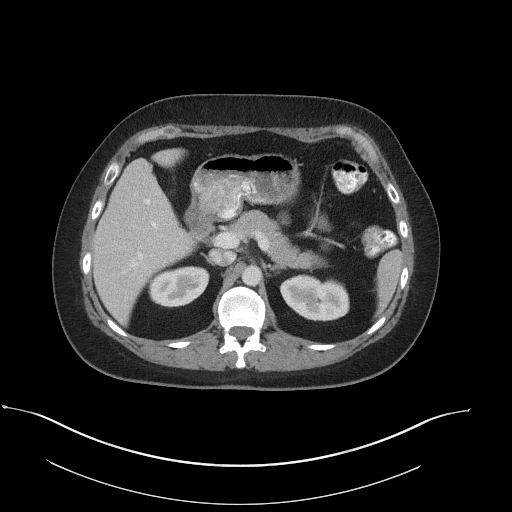
[im 76/105  bone]
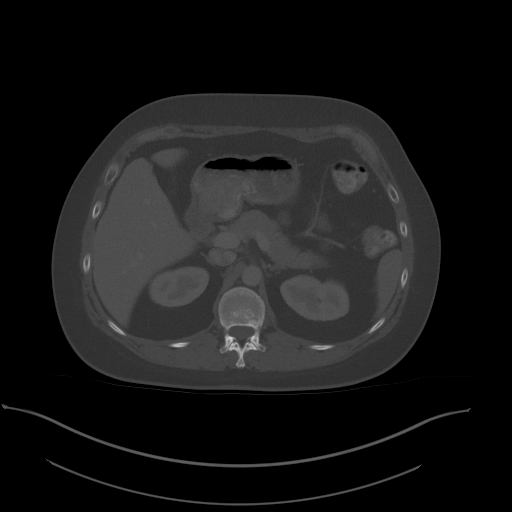
[im 81/105  soft-tissue]
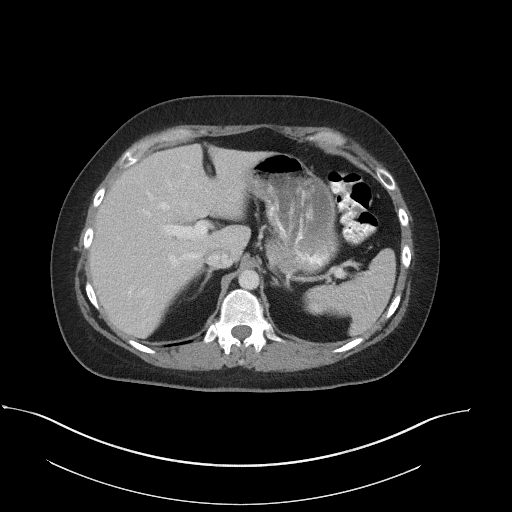
[im 87/105  soft-tissue]
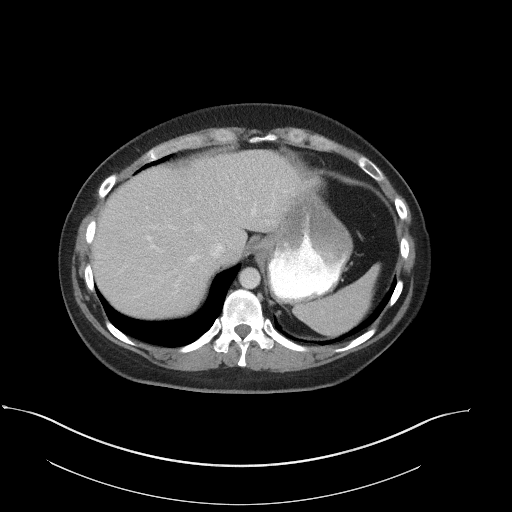
[im 99/105  soft-tissue]
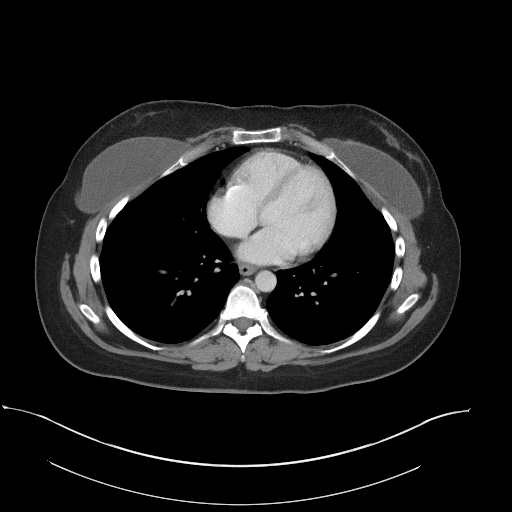

[Series 5: coronal st · coronal · 0.77mm/px · 3 of 117 slices shown]
[im 39/117  soft-tissue]
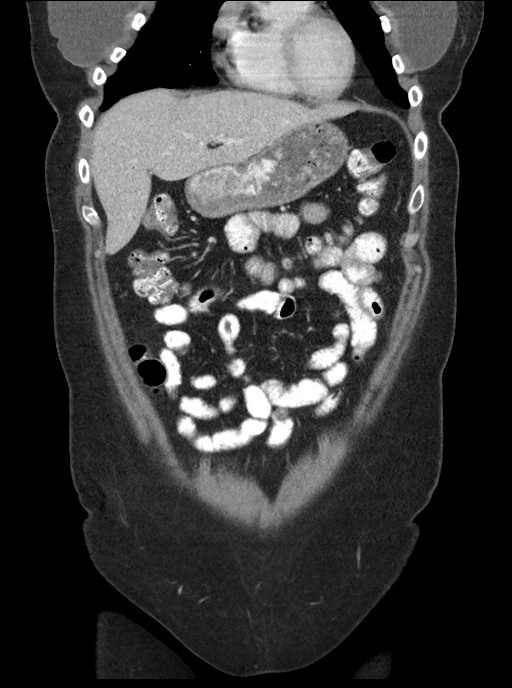
[im 52/117  soft-tissue]
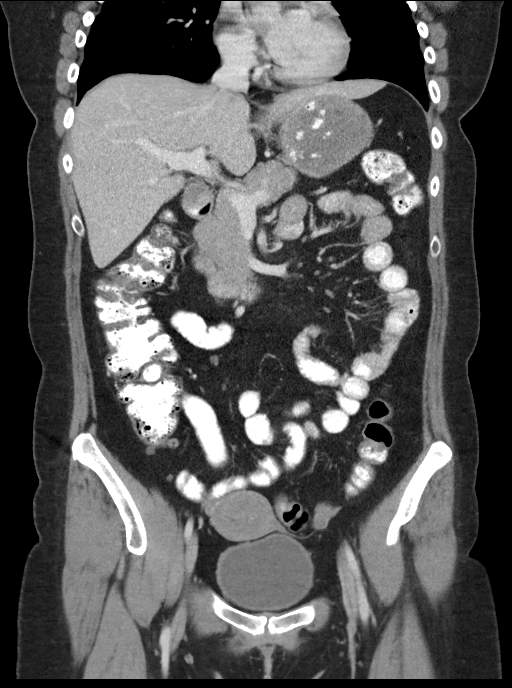
[im 65/117  soft-tissue]
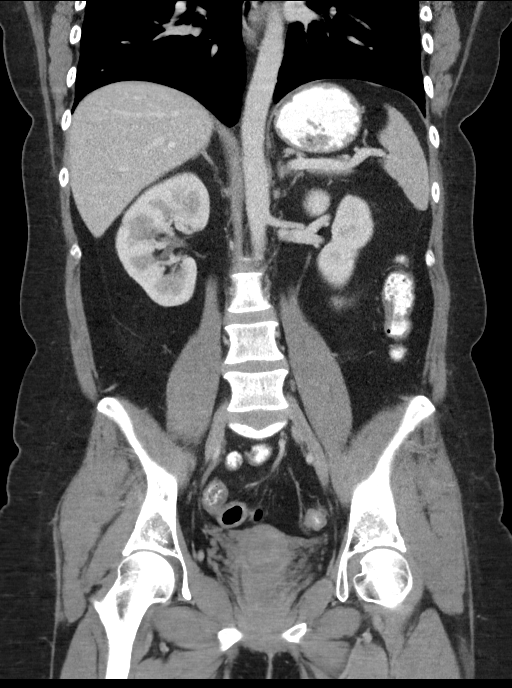

[15 of 46 positions shown; findings below may reference images not displayed]

FINDINGS: Lower chest: There is mild atelectatic change in the anterior left
base. Lung bases otherwise are clear.

Hepatobiliary: No focal liver lesions are appreciable. Gallbladder
is absent. There is no appreciable biliary duct dilatation.

Pancreas: There is no pancreatic mass or inflammatory focus.

Spleen: No splenic lesions are evident.

Adrenals/Urinary Tract: Adrenals bilaterally appear normal. Kidneys
bilaterally show no evident mass or hydronephrosis on either side.
There is no renal or ureteral calculus on either side. Urinary
bladder is midline with wall thickness within normal limits.

Stomach/Bowel: There is no appreciable bowel wall or mesenteric
thickening. There is no evident bowel obstruction. There is no free
air or portal venous air. Terminal ileum appears normal.

Vascular/Lymphatic: There is no abdominal aortic aneurysm. No
vascular lesions are appreciable. There is no adenopathy in the
abdomen or pelvis.

Reproductive: Uterus is anteverted. There is no appreciable pelvic
mass. A ring-like device is noted in the vagina.

Other: The appendix appears normal. There is no abscess or ascites
in the abdomen or pelvis. There is a small ventral hernia containing
only fat.

Musculoskeletal: There are no blastic or lytic bone lesions. There
are breast implants bilaterally. There is no intramuscular lesion
evident.
IMPRESSION: 1. A cause for patient's symptoms has not been established with this
study.

2. No evident bowel wall thickening or bowel obstruction. No abscess
in the abdomen pelvis. Appendix appears normal.

3. No renal or ureteral calculus. No hydronephrosis. Urinary bladder
wall thickness is within normal limits.

4.  Small ventral hernia containing only fat.

5.  Gallbladder absent.

## 2021-02-03 ENCOUNTER — Other Ambulatory Visit (INDEPENDENT_AMBULATORY_CARE_PROVIDER_SITE_OTHER): Payer: Self-pay

## 2021-02-03 DIAGNOSIS — R110 Nausea: Secondary | ICD-10-CM

## 2021-02-03 DIAGNOSIS — R1084 Generalized abdominal pain: Secondary | ICD-10-CM

## 2021-02-03 DIAGNOSIS — R197 Diarrhea, unspecified: Secondary | ICD-10-CM

## 2021-02-03 MED ORDER — HYOSCYAMINE SULFATE SL 0.125 MG SL SUBL: 0.1250 mg | SUBLINGUAL_TABLET | Freq: Four times a day (QID) | SUBLINGUAL | 3 refills | Status: DC | PRN

## 2021-02-03 MED ORDER — ONDANSETRON 4 MG PO TBDP: 4.0000 mg | ORAL_TABLET | Freq: Three times a day (TID) | ORAL | 1 refills | Status: AC | PRN

## 2021-04-12 ENCOUNTER — Telehealth (INDEPENDENT_AMBULATORY_CARE_PROVIDER_SITE_OTHER): Payer: Self-pay | Admitting: Internal Medicine

## 2021-04-12 ENCOUNTER — Other Ambulatory Visit (INDEPENDENT_AMBULATORY_CARE_PROVIDER_SITE_OTHER): Payer: Self-pay | Admitting: Gastroenterology

## 2021-04-12 DIAGNOSIS — R197 Diarrhea, unspecified: Secondary | ICD-10-CM

## 2021-04-12 NOTE — Telephone Encounter (Signed)
Patient called stated she is having some rectal bleeding - please advise - ph# [phone]

## 2021-04-12 NOTE — Telephone Encounter (Signed)
Spoke with the patient, reports having diarrhea and cramping since last week, had some scant blood today in stool and got concerned. Will rule out infection. If she has persistent rectal bleeding and negative testing, may need to do a colonoscopy. Please send the orders for stool testing.

## 2021-04-12 NOTE — Telephone Encounter (Signed)
Patient aware to come by tomorrow 04/13/2021 to pick up orders to take to [facility] to get her stool collection containers.

## 2023-12-13 ENCOUNTER — Telehealth: Payer: Self-pay | Admitting: *Deleted

## 2023-12-13 ENCOUNTER — Encounter: Payer: Self-pay | Admitting: *Deleted

## 2023-12-13 ENCOUNTER — Encounter: Payer: Self-pay | Admitting: Internal Medicine

## 2023-12-13 ENCOUNTER — Other Ambulatory Visit: Payer: Self-pay | Admitting: *Deleted

## 2023-12-13 ENCOUNTER — Ambulatory Visit (INDEPENDENT_AMBULATORY_CARE_PROVIDER_SITE_OTHER): Payer: No Typology Code available for payment source | Admitting: Internal Medicine

## 2023-12-13 VITALS — BP 112/78 | HR 93 | Temp 97.3°F | Ht 70.0 in | Wt 175.8 lb

## 2023-12-13 DIAGNOSIS — R1032 Left lower quadrant pain: Secondary | ICD-10-CM | POA: Diagnosis not present

## 2023-12-13 DIAGNOSIS — K5900 Constipation, unspecified: Secondary | ICD-10-CM | POA: Diagnosis not present

## 2023-12-13 DIAGNOSIS — R197 Diarrhea, unspecified: Secondary | ICD-10-CM

## 2023-12-13 DIAGNOSIS — R195 Other fecal abnormalities: Secondary | ICD-10-CM

## 2023-12-13 MED ORDER — NA SULFATE-K SULFATE-MG SULF 17.5-3.13-1.6 GM/177ML PO SOLN: ORAL | 0 refills | Status: DC

## 2023-12-13 NOTE — Telephone Encounter (Signed)
Called [facility] at [phone] and spoke with [facility].  Case # [id number] Faxed clinicals to [phone]

## 2023-12-13 NOTE — Addendum Note (Signed)
Addended by: Armstead Peaks on: 12/13/2023 12:11 PM   Modules accepted: Orders

## 2023-12-13 NOTE — Progress Notes (Signed)
Primary Care Physician:  Assunta Found, MD Primary Gastroenterologist:  Dr. Marletta Lor  Chief Complaint  Patient presents with   Constipation    Patient here today with concerns of having constipation that alternates with diarrhea. She says this is interfering with her daily life. She says she has some lower left abdominal pain, that it persistent. She is not taking dicyclomine. She takes lomotil prn diarrhea two to three times a day.She has in the past taken family members Linzess, suppositories,fiber, enemas for constipation,and nothing is helping. Patient says her pcp had given her prednisone due to sciatica pain, and while on prednisone her     HPI:   Cynthia Johns is a 43 y.o. female who presents the clinic today by referral from her PCP Dr. Phillips Odor for evaluation.  Has not been seen in our office since 2021.  Multiple GI complaints for me today.  Reports she has had GI issues going back to 2008.  Changes in bowel habits: fluctuates between constipation and diarrhea. She has in the past taken family members Linzess, suppositories, fiber, enemas for constipation, and nothing is helping. Takes lomotil up to 3 times a day when she has bouts of diarrhea.  Reports being placed on course of prednisone actually improved her GI symptoms as well.  They soon returned after completing this medication however.  Occasional blood when wipes, does note occasional mucus in stool.   Reports chronic LLQ abdominal pain. Seen by gynecology who diagnosed her with adenomyosis. Pain somewhat improves with having a BM.  Pain moderate to severe at times, does not radiate.  CT abd/pelvis w contrast 04/02/19 unremarkable. S/P [facility] 2012.   Reports 2 Aunt's with Crohn's disease.  No prior colonoscopy.  Denies any heartburn/acid reflux, dysphagia/odynophagia, nausea, epigastric or chest pain.    Past Medical History:  Diagnosis Date   Anxiety    Overweight    Sinus tachycardia    SVT (supraventricular  tachycardia) (HCC)     Past Surgical History:  Procedure Laterality Date   BREAST ENHANCEMENT SURGERY     CHOLECYSTECTOMY     SVT ABLATION N/A 09/12/2017   Procedure: SVT Ablation;  Surgeon: Hillis Range, MD;  Location: [facility];  Service: Cardiovascular;  Laterality: N/A;   TONSILLECTOMY      Current Outpatient Medications  Medication Sig Dispense Refill   diphenoxylate-atropine (LOMOTIL) 2.5-0.025 MG tablet Take by mouth 4 (four) times daily as needed for diarrhea or loose stools.     ondansetron (ZOFRAN ODT) 4 MG disintegrating tablet Take 1 tablet (4 mg total) by mouth every 8 (eight) hours as needed for nausea or vomiting. 30 tablet 1   OVER THE COUNTER MEDICATION Probiotic one per day.     Phenylephrine-Ibuprofen (ADVIL SINUS CONGESTION & PAIN) 10-200 MG TABS Take 1 tablet by mouth every 12 (twelve) hours as needed (congestion).     sulfamethoxazole-trimethoprim (BACTRIM DS) 800-160 MG tablet Take 1 tablet by mouth 2 (two) times daily.     Cholecalciferol (VITAMIN D3) 1.25 MG (50000 UT) CAPS TAKE ONE CAPSULE BY MOUTH ONCE A WEEK. (Patient not taking: Reported on 12/13/2023) 4 capsule 2   cyanocobalamin (,VITAMIN B-12,) 1000 MCG/ML injection Inject 1 mL (1,000 mcg total) into the muscle every 30 (thirty) days. (Patient not taking: Reported on 06/04/2020)     dicyclomine (BENTYL) 20 MG tablet Take 0.5 tablets (10 mg total) by mouth daily before breakfast. (Patient not taking: Reported on 12/13/2023) 30 tablet 2   Hyoscyamine Sulfate SL (  LEVSIN/SL) 0.125 MG SUBL Place 0.125 mg under the tongue 4 (four) times daily as needed. Abd cramping, diarrhea (Patient not taking: Reported on 12/13/2023) 60 tablet 3   pantoprazole (PROTONIX) 40 MG tablet Take 1 tablet (40 mg total) by mouth daily before breakfast. (Patient not taking: Reported on 12/13/2023) 30 tablet 3   polyethylene glycol powder (GLYCOLAX/MIRALAX) 17 GM/SCOOP powder Take 17 g by mouth daily. (Patient not taking: Reported on  12/13/2023) 507 g 5   No current facility-administered medications for this visit.    Allergies as of 12/13/2023 - Review Complete 12/13/2023  Allergen Reaction Noted   Contrast media [iodinated contrast media] Rash 04/03/2019    Family History  Problem Relation Age of Onset   Hypertension Mother     Social History   Socioeconomic History   Marital status: Married    Spouse name: Not on file   Number of children: Not on file   Years of education: Not on file   Highest education level: Not on file  Occupational History   Not on file  Tobacco Use   Smoking status: Never   Smokeless tobacco: Never  Vaping Use   Vaping status: Never Used  Substance and Sexual Activity   Alcohol use: No   Drug use: No   Sexual activity: Yes    Birth control/protection: Inserts  Other Topics Concern   Not on file  Social History Narrative   Lives in [place] with spouse and daughter   Works at [employer] as a Associate Professor   Social Drivers of Corporate investment banker Strain: Not on file  Food Insecurity: Not on file  Transportation Needs: Not on file  Physical Activity: Not on file  Stress: Not on file  Social Connections: Unknown (03/26/2022)   Received from [facility], [facility]   Social Network    Social Network: Not on file  Intimate Partner Violence: Unknown (02/21/2022)   Received from [facility], [facility]   HITS    Physically Hurt: Not on file    Insult or Talk Down To: Not on file    Threaten Physical Harm: Not on file    Scream or Curse: Not on file    Subjective: Review of Systems  Constitutional:  Negative for chills and fever.  HENT:  Negative for congestion and hearing loss.   Eyes:  Negative for blurred vision and double vision.  Respiratory:  Negative for cough and shortness of breath.   Cardiovascular:  Negative for chest pain and palpitations.  Gastrointestinal:  Positive for abdominal pain, constipation and diarrhea. Negative for blood in  stool, heartburn, melena and vomiting.  Genitourinary:  Negative for dysuria and urgency.  Musculoskeletal:  Negative for joint pain and myalgias.  Skin:  Negative for itching and rash.  Neurological:  Negative for dizziness and headaches.  Psychiatric/Behavioral:  Negative for depression. The patient is not nervous/anxious.        Objective: BP 112/78 (BP Location: Left Arm, Patient Position: Sitting, Cuff Size: Normal)   Pulse 93   Temp (!) 97.3 F (36.3 C) (Temporal)   Ht 5\' 10"  (1.778 m)   Wt 175 lb 12.8 oz (79.7 kg)   BMI 25.22 kg/m  Physical Exam Constitutional:      Appearance: Normal appearance.  HENT:     Head: Normocephalic and atraumatic.  Eyes:     Extraocular Movements: Extraocular movements intact.     Conjunctiva/sclera: Conjunctivae normal.  Cardiovascular:     Rate and  Rhythm: Normal rate and regular rhythm.  Pulmonary:     Effort: Pulmonary effort is normal.     Breath sounds: Normal breath sounds.  Abdominal:     General: Bowel sounds are normal.     Palpations: Abdomen is soft.  Musculoskeletal:        General: No swelling. Normal range of motion.     Cervical back: Normal range of motion and neck supple.  Skin:    General: Skin is warm and dry.     Coloration: Skin is not jaundiced.  Neurological:     General: No focal deficit present.     Mental Status: She is alert and oriented to person, place, and time.  Psychiatric:        Mood and Affect: Mood normal.        Behavior: Behavior normal.      Assessment: *Change in bowel habits-constipation and diarrhea *Left lower quadrant abdominal pain *Mucus in stool  Plan: Etiology of patient's symptoms unclear.  Given the chronicity and severity of her symptoms, I think she warrants further evaluation with colonoscopy which we will schedule today.  The risks including infection, bleed, or perforation as well as benefits, limitations, alternatives and imponderables have been reviewed with the  patient. Questions have been answered. All parties agreeable.  Continue Lomotil as needed.  Follow-up after colonoscopy. 12/13/2023 11:26 AM   Disclaimer: This note was dictated with voice recognition software. Similar sounding words can inadvertently be transcribed and may not be corrected upon review.

## 2023-12-13 NOTE — Telephone Encounter (Signed)
PA approved. Ref# [id number], DOS 12/29/2023

## 2023-12-13 NOTE — Patient Instructions (Signed)
We will schedule you for colonoscopy to further evaluate your symptoms.  Continue on Lomotil as needed.  It was very nice meeting you today.  Dr. Marletta Lor

## 2023-12-27 ENCOUNTER — Other Ambulatory Visit (HOSPITAL_COMMUNITY)
Admission: RE | Admit: 2023-12-27 | Discharge: 2023-12-27 | Disposition: A | Discharge status: Home / Self Care | Payer: No Typology Code available for payment source | Source: Ambulatory Visit | Attending: Internal Medicine | Admitting: Internal Medicine

## 2023-12-27 DIAGNOSIS — R197 Diarrhea, unspecified: Secondary | ICD-10-CM | POA: Insufficient documentation

## 2023-12-27 DIAGNOSIS — R1032 Left lower quadrant pain: Secondary | ICD-10-CM | POA: Diagnosis present

## 2023-12-27 DIAGNOSIS — R195 Other fecal abnormalities: Secondary | ICD-10-CM | POA: Diagnosis present

## 2023-12-27 LAB — PREGNANCY, URINE: Preg Test, Ur: NEGATIVE

## 2023-12-29 ENCOUNTER — Ambulatory Visit (HOSPITAL_COMMUNITY): Payer: No Typology Code available for payment source | Admitting: Anesthesiology

## 2023-12-29 ENCOUNTER — Ambulatory Visit (HOSPITAL_BASED_OUTPATIENT_CLINIC_OR_DEPARTMENT_OTHER): Payer: No Typology Code available for payment source | Admitting: Anesthesiology

## 2023-12-29 ENCOUNTER — Ambulatory Visit (HOSPITAL_COMMUNITY)
Admission: RE | Admit: 2023-12-29 | Discharge: 2023-12-29 | Disposition: A | Discharge status: Home / Self Care | Payer: No Typology Code available for payment source | Attending: Internal Medicine | Admitting: Internal Medicine

## 2023-12-29 ENCOUNTER — Encounter (HOSPITAL_COMMUNITY)
Admission: RE | Disposition: A | Discharge status: Home / Self Care | Payer: Self-pay | Source: Home / Self Care | Attending: Internal Medicine

## 2023-12-29 ENCOUNTER — Encounter (HOSPITAL_COMMUNITY): Payer: Self-pay | Admitting: Internal Medicine

## 2023-12-29 ENCOUNTER — Other Ambulatory Visit: Payer: Self-pay

## 2023-12-29 DIAGNOSIS — R197 Diarrhea, unspecified: Secondary | ICD-10-CM | POA: Diagnosis not present

## 2023-12-29 DIAGNOSIS — Z8379 Family history of other diseases of the digestive system: Secondary | ICD-10-CM | POA: Diagnosis not present

## 2023-12-29 DIAGNOSIS — K219 Gastro-esophageal reflux disease without esophagitis: Secondary | ICD-10-CM | POA: Diagnosis not present

## 2023-12-29 DIAGNOSIS — K5900 Constipation, unspecified: Secondary | ICD-10-CM | POA: Diagnosis not present

## 2023-12-29 DIAGNOSIS — G8929 Other chronic pain: Secondary | ICD-10-CM | POA: Diagnosis not present

## 2023-12-29 DIAGNOSIS — K648 Other hemorrhoids: Secondary | ICD-10-CM

## 2023-12-29 DIAGNOSIS — R194 Change in bowel habit: Secondary | ICD-10-CM

## 2023-12-29 DIAGNOSIS — R1032 Left lower quadrant pain: Secondary | ICD-10-CM | POA: Diagnosis not present

## 2023-12-29 DIAGNOSIS — K625 Hemorrhage of anus and rectum: Secondary | ICD-10-CM

## 2023-12-29 DIAGNOSIS — R195 Other fecal abnormalities: Secondary | ICD-10-CM | POA: Insufficient documentation

## 2023-12-29 DIAGNOSIS — Z79899 Other long term (current) drug therapy: Secondary | ICD-10-CM | POA: Insufficient documentation

## 2023-12-29 HISTORY — PX: COLONOSCOPY WITH PROPOFOL: SHX5780

## 2023-12-29 SURGERY — COLONOSCOPY WITH PROPOFOL
Anesthesia: General

## 2023-12-29 MED ORDER — SODIUM CHLORIDE 0.9% FLUSH: 3.0000 mL | Freq: Two times a day (BID) | INTRAVENOUS | Status: DC

## 2023-12-29 MED ORDER — SODIUM CHLORIDE 0.9% FLUSH: 3.0000 mL | INTRAVENOUS | Status: DC | PRN

## 2023-12-29 MED ORDER — PROPOFOL 10 MG/ML IV BOLUS
INTRAVENOUS | Status: DC | PRN
  Administered 2023-12-29: 50 mg via INTRAVENOUS
  Administered 2023-12-29: 100 mg via INTRAVENOUS

## 2023-12-29 MED ORDER — LIDOCAINE HCL (PF) 2 % IJ SOLN
INTRAMUSCULAR | Status: DC | PRN
  Administered 2023-12-29: 50 mg via INTRADERMAL

## 2023-12-29 MED ORDER — LACTATED RINGERS IV SOLN: INTRAVENOUS | Status: DC | PRN

## 2023-12-29 MED ORDER — PROPOFOL 500 MG/50ML IV EMUL
INTRAVENOUS | Status: DC | PRN
  Administered 2023-12-29: 150 ug/kg/min via INTRAVENOUS

## 2023-12-29 MED ORDER — STERILE WATER FOR IRRIGATION IR SOLN
Status: DC | PRN
  Administered 2023-12-29: 60 mL

## 2023-12-29 NOTE — Op Note (Signed)
 [facility] Patient Name: Cynthia Johns Procedure Date: 12/29/2023 1:02 PM MRN: [id number] Date of Birth: 10-28-1981 Attending MD: Carlin POUR. Cindie , [place], [id number] CSN: [id number] Age: 43 Admit Type: Outpatient Procedure:                Colonoscopy Indications:              Rectal bleeding, Change in bowel habits Providers:                Carlin POUR. Cindie, DO, Emilee Tubb RN, RN, Mickel FREDRIK Shirlean Alexandro, Technician Referring MD:              Medicines:                See the Anesthesia note for documentation of the                            administered medications Complications:            No immediate complications. Estimated Blood Loss:     Estimated blood loss: none. Procedure:                Pre-Anesthesia Assessment:                           - The anesthesia plan was to use monitored                            anesthesia care (MAC).                           After obtaining informed consent, the colonoscope                            was passed under direct vision. Throughout the                            procedure, the patient's blood pressure, pulse, and                            oxygen saturations were monitored continuously. The                            [id number]-HQ190L ([id number]) scope was introduced through                            the anus and advanced to the the terminal ileum,                            with identification of the appendiceal orifice and                            IC valve. The colonoscopy was performed without                            difficulty.  The patient tolerated the procedure                            well. The quality of the bowel preparation was                            evaluated using the BBPS [facility] Bowel Preparation                            Scale) with scores of: Right Colon = 3, Transverse                            Colon = 3 and Left Colon = 3 (entire mucosa seen                            well with  no residual staining, small fragments of                            stool or opaque liquid). The total BBPS score                            equals 9. Scope In: 1:09:35 PM Scope Out: 1:21:58 PM Scope Withdrawal Time: 0 hours 8 minutes 56 seconds  Total Procedure Duration: 0 hours 12 minutes 23 seconds  Findings:      Non-bleeding internal hemorrhoids were found during endoscopy.      The terminal ileum appeared normal.      The entire examined colon appeared normal. Impression:               - Non-bleeding internal hemorrhoids.                           - The examined portion of the ileum was normal.                           - The entire examined colon is normal.                           - No specimens collected. Moderate Sedation:      Per Anesthesia Care Recommendation:           - Patient has a contact number available for                            emergencies. The signs and symptoms of potential                            delayed complications were discussed with the                            patient. Return to normal activities tomorrow.                            Written discharge instructions were provided to the  patient.                           - Resume previous diet.                           - Continue present medications.                           - Repeat colonoscopy in 10 years for screening                            purposes.                           - Return to GI clinic in 6 weeks. Procedure Code(s):        --- Professional ---                           [phone], Colonoscopy, flexible; diagnostic, including                            collection of specimen(s) by brushing or washing,                            when performed (separate procedure) Diagnosis Code(s):        --- Professional ---                           K64.8, Other hemorrhoids                           K62.5, Hemorrhage of anus and rectum                           R19.4,  Change in bowel habit CPT copyright 2022 American Medical Association. All rights reserved. The codes documented in this report are preliminary and upon coder review may  be revised to meet current compliance requirements. Carlin POUR. Cindie, DO Carlin POUR. Cindie, DO 12/29/2023 1:25:23 PM This report has been signed electronically. Number of Addenda: 0

## 2023-12-29 NOTE — Anesthesia Postprocedure Evaluation (Signed)
 Anesthesia Post Note  Patient: Cynthia Johns  Procedure(s) Performed: COLONOSCOPY WITH PROPOFOL   Patient location during evaluation: PACU Anesthesia Type: General Level of consciousness: awake and alert Pain management: pain level controlled Vital Signs Assessment: post-procedure vital signs reviewed and stable Respiratory status: spontaneous breathing, nonlabored ventilation, respiratory function stable and patient connected to nasal cannula oxygen Cardiovascular status: blood pressure returned to baseline and stable Postop Assessment: no apparent nausea or vomiting Anesthetic complications: no   There were no known notable events for this encounter.   Last Vitals:  Vitals:   12/29/23 1334 12/29/23 1338  BP: (!) 88/58 97/68  Pulse: 87   Resp: 18   Temp:    SpO2: 97%     Last Pain:  Vitals:   12/29/23 1334  TempSrc:   PainSc: 0-No pain                 Yarlin Breisch L Camari Wisham

## 2023-12-29 NOTE — Anesthesia Procedure Notes (Deleted)
 Date/Time: 12/29/2023 1:04 PM  Performed by: Eliodoro Deward FALCON, CRNAPre-anesthesia Checklist: Patient identified, Emergency Drugs available, Suction available and Patient being monitored Patient Re-evaluated:Patient Re-evaluated prior to induction Oxygen Delivery Method: Nasal cannula Induction Type: IV induction Placement Confirmation: positive ETCO2

## 2023-12-29 NOTE — Anesthesia Procedure Notes (Addendum)
 Date/Time: 12/29/2023 1:04 PM  Performed by: Eliodoro Deward FALCON, CRNAPre-anesthesia Checklist: Patient identified, Emergency Drugs available, Suction available and Patient being monitored Patient Re-evaluated:Patient Re-evaluated prior to induction Oxygen Delivery Method: Nasal cannula Induction Type: IV induction Placement Confirmation: positive ETCO2

## 2023-12-29 NOTE — Discharge Instructions (Addendum)
  Colonoscopy Discharge Instructions  Read the instructions outlined below and refer to this sheet in the next few weeks. These discharge instructions provide you with general information on caring for yourself after you leave the hospital. Your doctor may also give you specific instructions. While your treatment has been planned according to the most current medical practices available, unavoidable complications occasionally occur.   ACTIVITY You may resume your regular activity, but move at a slower pace for the next 24 hours.  Take frequent rest periods for the next 24 hours.  Walking will help get rid of the air and reduce the bloated feeling in your belly (abdomen).  No driving for 24 hours (because of the medicine (anesthesia) used during the test).   Do not sign any important legal documents or operate any machinery for 24 hours (because of the anesthesia used during the test).  NUTRITION Drink plenty of fluids.  You may resume your normal diet as instructed by your doctor.  Begin with a light meal and progress to your normal diet. Heavy or fried foods are harder to digest and may make you feel sick to your stomach (nauseated).  Avoid alcoholic beverages for 24 hours or as instructed.  MEDICATIONS You may resume your normal medications unless your doctor tells you otherwise.  WHAT YOU CAN EXPECT TODAY Some feelings of bloating in the abdomen.  Passage of more gas than usual.  Spotting of blood in your stool or on the toilet paper.  IF YOU HAD POLYPS REMOVED DURING THE COLONOSCOPY: No aspirin products for 7 days or as instructed.  No alcohol for 7 days or as instructed.  Eat a soft diet for the next 24 hours.  FINDING OUT THE RESULTS OF YOUR TEST Not all test results are available during your visit. If your test results are not back during the visit, make an appointment with your caregiver to find out the results. Do not assume everything is normal if you have not heard from your  caregiver or the medical facility. It is important for you to follow up on all of your test results.  SEEK IMMEDIATE MEDICAL ATTENTION IF: You have more than a spotting of blood in your stool.  Your belly is swollen (abdominal distention).  You are nauseated or vomiting.  You have a temperature over 101.  You have abdominal pain or discomfort that is severe or gets worse throughout the day.   Overall, your colon appeared very healthy.  I did not see any active inflammation indicative of underlying inflammatory bowel disease such as Crohn's disease or ulcerative colitis throughout your colon or end portion of your small bowel.     I did not find any polyps or evidence of colon cancer.  I recommend repeating colonoscopy in 10 years for colon cancer screening purposes.  You do have  internal hemorrhoids.  We can discuss treatment of these if bleeding continues  Follow-up in GI office in 4 to 6 weeks. Message sent to the office and will contact you about an appointment.    I hope you have a great rest of your week!  Carlin POUR. Cindie, D.O. Gastroenterology and Hepatology [facility]

## 2023-12-29 NOTE — Interval H&P Note (Signed)
 History and Physical Interval Note:  12/29/2023 1:01 PM  Cynthia Johns  has presented today for surgery, with the diagnosis of mucus in stool, diarrhea, left lower quadrant pain.  The various methods of treatment have been discussed with the patient and family. After consideration of risks, benefits and other options for treatment, the patient has consented to  Procedure(s) with comments: COLONOSCOPY WITH PROPOFOL  (N/A) - 200pm, asa 2 as a surgical intervention.  The patient's history has been reviewed, patient examined, no change in status, stable for surgery.  I have reviewed the patient's chart and labs.  Questions were answered to the patient's satisfaction.     Carlin MARLA Hasty

## 2023-12-29 NOTE — Anesthesia Preprocedure Evaluation (Signed)
 Anesthesia Evaluation  Patient identified by MRN, date of birth, ID band Patient awake    Reviewed: Allergy & Precautions, H&P , NPO status , Patient's Chart, lab work & pertinent test results, reviewed documented beta blocker date and time   Airway Mallampati: I  TM Distance: >3 FB Neck ROM: Full    Dental no notable dental hx. (+) Dental Advisory Given, Teeth Intact   Pulmonary neg pulmonary ROS   Pulmonary exam normal breath sounds clear to auscultation       Cardiovascular Exercise Tolerance: Good negative cardio ROS Normal cardiovascular exam Rhythm:regular Rate:Normal  SVT ablation   Neuro/Psych   Anxiety Depression    negative neurological ROS  negative psych ROS   GI/Hepatic negative GI ROS, Neg liver ROS,GERD  Medicated and Controlled,,  Endo/Other  negative endocrine ROS    Renal/GU negative Renal ROS  negative genitourinary   Musculoskeletal   Abdominal   Peds  Hematology negative hematology ROS (+)   Anesthesia Other Findings   Reproductive/Obstetrics negative OB ROS                             Anesthesia Physical Anesthesia Plan  ASA: 2  Anesthesia Plan: General   Post-op Pain Management: Minimal or no pain anticipated   Induction: Intravenous  PONV Risk Score and Plan: 3 and Propofol  infusion  Airway Management Planned: Natural Airway and Nasal Cannula  Additional Equipment:   Intra-op Plan:   Post-operative Plan:   Informed Consent: I have reviewed the patients History and Physical, chart, labs and discussed the procedure including the risks, benefits and alternatives for the proposed anesthesia with the patient or authorized representative who has indicated his/her understanding and acceptance.     Dental Advisory Given  Plan Discussed with: CRNA  Anesthesia Plan Comments:         Anesthesia Quick Evaluation

## 2023-12-29 NOTE — Transfer of Care (Signed)
 Immediate Anesthesia Transfer of Care Note  Patient: Cynthia Johns  Procedure(s) Performed: COLONOSCOPY WITH PROPOFOL   Patient Location: Endoscopy Unit  Anesthesia Type:General  Level of Consciousness: drowsy  Airway & Oxygen Therapy: Patient Spontanous Breathing  Post-op Assessment: Report given to RN and Post -op Vital signs reviewed and stable  Post vital signs: Reviewed and stable  Last Vitals:  Vitals Value Taken Time  BP 74/47 12/29/23 1325  Temp 36.8 C 12/29/23 1325  Pulse 82 12/29/23 1325  Resp 18 12/29/23 1325  SpO2 96 % 12/29/23 1325    Last Pain:  Vitals:   12/29/23 1325  TempSrc: Oral  PainSc:       Patients Stated Pain Goal: 5 (12/29/23 1236)  Complications: No notable events documented.

## 2024-01-01 ENCOUNTER — Encounter (HOSPITAL_COMMUNITY): Payer: Self-pay | Admitting: Internal Medicine

## 2024-01-30 ENCOUNTER — Ambulatory Visit (INDEPENDENT_AMBULATORY_CARE_PROVIDER_SITE_OTHER): Payer: No Typology Code available for payment source | Admitting: Gastroenterology

## 2024-01-30 ENCOUNTER — Encounter: Payer: Self-pay | Admitting: Gastroenterology

## 2024-01-30 VITALS — BP 124/83 | HR 94 | Temp 98.4°F | Ht 70.0 in | Wt 178.5 lb

## 2024-01-30 DIAGNOSIS — R197 Diarrhea, unspecified: Secondary | ICD-10-CM | POA: Insufficient documentation

## 2024-01-30 DIAGNOSIS — R109 Unspecified abdominal pain: Secondary | ICD-10-CM | POA: Insufficient documentation

## 2024-01-30 DIAGNOSIS — R1032 Left lower quadrant pain: Secondary | ICD-10-CM | POA: Diagnosis not present

## 2024-01-30 MED ORDER — RIFAXIMIN 550 MG PO TABS: 550.0000 mg | ORAL_TABLET | Freq: Three times a day (TID) | ORAL | 0 refills | Status: AC

## 2024-01-30 NOTE — Patient Instructions (Signed)
 Please have blood work and stool tests done. I will be in touch with the results!  We may need more dedicated testing (scans, capsule study, etc).  I have sent in Xifaxan just to have at pharmacy in case we decide for you to take a 2 week course of this.    I will see you in 3 months regardless, but we will be addressing all of this in the meantime!  It was a pleasure to see you today. I want to create trusting relationships with patients and provide genuine, compassionate, and quality care. I truly value your feedback, so please be on the lookout for a survey regarding your visit with me today. I appreciate your time in completing this!         Gelene Mink, PhD, ANP-BC [facility]

## 2024-01-30 NOTE — Progress Notes (Signed)
 Gastroenterology Office Note     Primary Care Physician:  Assunta Found, MD  Primary Gastroenterologist: Dr. Marletta Lor    Chief Complaint   Chief Complaint  Patient presents with   Follow-up    Follow up after colonoscopy. No constipation. Pt now dealing with diarrhea up to 3 times     History of Present Illness   Cynthia Johns is a 43 y.o. female presenting today with a history of bowel habit changes, fluctuating between constipation and diarrhea. In interim from last appointment, had colonoscopy that was unrevealing other than internal hemorrhoids.    Celiac panel in 2020 negative and most recent end of 2024.Marland Kitchen Last CT on file in 2020.   This bout started in August. Since colonoscopy has had multiple episodes with urgency, has to know where bathrooms are, has had incontinent episodes. Will tense up, doesn't remember drive home. Gets weak, shaky withths. Much more anxiety with this. Different consistencies. Sometimes pure liquid, sometimes liquid with stool having ragged etches,, can't figure out triggers as sometimes fine and sometimes not. Will be morning or evening. Lomotil as needed. Pain always in LLQ when occurs.   Will have a few days of not having a BM and then have multiple days of BMs. Every day has had BMs. Feels sometimes like it might seep out. Feels like symptoms getting worse. Some form of this has been going on since 2008. In high school was 117 lbs and had severe GERD. Has always had digestive issues but not able to pinpoint what to do. She feels like in the past might have had overflow diarrhea but at this point in life feels like this is very different. May have one stool per day or multiple. Stool has a greasy film at times.   Took prednisone for sciatica in August and everything was resolved. FH of Crohn's.     Cholecystectomy May 2012. 2008 first took dicyclomine. Underlying nausea.   With anxiety: wellbutrin, SSRI, Cymbalta,   Feb 2025 colonoscopy:  non-bleeding internal hemorrhoids, normal examined portion of TI, normal colon.   Nov 2024 outside labs: celiac disease panel negative. TSH normal (1.88). no anemia. LFTS normal. Listed below.     Past Medical History:  Diagnosis Date   Anxiety    Overweight    Sinus tachycardia    SVT (supraventricular tachycardia) (HCC)     Past Surgical History:  Procedure Laterality Date   BREAST ENHANCEMENT SURGERY     CHOLECYSTECTOMY     COLONOSCOPY WITH PROPOFOL N/A 12/29/2023   Procedure: COLONOSCOPY WITH PROPOFOL;  Surgeon: Lanelle Bal, DO;  [facility];  Service: Endoscopy;  Laterality: N/A;  200pm, asa 2   SVT ABLATION N/A 09/12/2017   Procedure: SVT Ablation;  Surgeon: Hillis Range, MD;  Location: [facility];  Service: Cardiovascular;  Laterality: N/A;   TONSILLECTOMY      Current Outpatient Medications  Medication Sig Dispense Refill   ALPRAZolam (XANAX) 0.5 MG tablet Take 0.5 mg by mouth 2 (two) times daily as needed for anxiety.     diphenoxylate-atropine (LOMOTIL) 2.5-0.025 MG tablet Take by mouth 4 (four) times daily as needed for diarrhea or loose stools.     ondansetron (ZOFRAN ODT) 4 MG disintegrating tablet Take 1 tablet (4 mg total) by mouth every 8 (eight) hours as needed for nausea or vomiting. 30 tablet 1   OVER THE COUNTER MEDICATION Probiotic one per day. (Patient not taking: Reported on 01/30/2024)  Phenylephrine-Ibuprofen (ADVIL SINUS CONGESTION & PAIN) 10-200 MG TABS Take 1 tablet by mouth every 12 (twelve) hours as needed (congestion). (Patient not taking: Reported on 01/30/2024)     sulfamethoxazole-trimethoprim (BACTRIM DS) 800-160 MG tablet Take 1 tablet by mouth 2 (two) times daily. (Patient not taking: Reported on 01/30/2024)     No current facility-administered medications for this visit.    Allergies as of 01/30/2024 - Review Complete 01/30/2024  Allergen Reaction Noted   Contrast media [iodinated contrast media] Rash 04/03/2019     Family History  Problem Relation Age of Onset   Hypertension Mother     Social History   Socioeconomic History   Marital status: Married    Spouse name: Not on file   Number of children: Not on file   Years of education: Not on file   Highest education level: Not on file  Occupational History   Not on file  Tobacco Use   Smoking status: Never   Smokeless tobacco: Never  Vaping Use   Vaping status: Never Used  Substance and Sexual Activity   Alcohol use: No   Drug use: No   Sexual activity: Yes    Birth control/protection: Inserts  Other Topics Concern   Not on file  Social History Narrative   Lives in [place] with spouse and daughter   Works at [employer] as a Associate Professor   Social Drivers of Corporate investment banker Strain: Not on file  Food Insecurity: Not on file  Transportation Needs: Not on file  Physical Activity: Not on file  Stress: Not on file  Social Connections: Unknown (03/26/2022)   Received from [facility], [facility]   Social Network    Social Network: Not on file  Intimate Partner Violence: Unknown (02/21/2022)   Received from [facility], [facility]   HITS    Physically Hurt: Not on file    Insult or Talk Down To: Not on file    Threaten Physical Harm: Not on file    Scream or Curse: Not on file     Review of Systems   Gen: Denies any fever, chills, fatigue, weight loss, lack of appetite.  CV: Denies chest pain, heart palpitations, peripheral edema, syncope.  Resp: Denies shortness of breath at rest or with exertion. Denies wheezing or cough.  GI: Denies dysphagia or odynophagia. Denies jaundice, hematemesis, fecal incontinence. GU : Denies urinary burning, urinary frequency, urinary hesitancy MS: Denies joint pain, muscle weakness, cramps, or limitation of movement.  Derm: Denies rash, itching, dry skin Psych: Denies depression, anxiety, memory loss, and confusion Heme: Denies bruising, bleeding, and enlarged lymph  nodes.   Physical Exam   BP 124/83   Pulse 94   Temp 98.4 F (36.9 C)   Ht 5\' 10"  (1.778 m)   Wt 178 lb 8 oz (81 kg)   LMP 01/16/2024   BMI 25.61 kg/m  General:   Alert and oriented. Pleasant and cooperative. Well-nourished and well-developed.  Head:  Normocephalic and atraumatic. Eyes:  Without icterus Abdomen:  +BS, soft, non-tender and non-distended. No HSM noted. No guarding or rebound. No masses appreciated.  Rectal:  Deferred  Msk:  Symmetrical without gross deformities. Normal posture. Extremities:  Without edema. Neurologic:  Alert and  oriented x4;  grossly normal neurologically. Skin:  Intact without significant lesions or rashes. Psych:  Alert and cooperative. Normal mood and affect.  Nov 2024 outside labs:         Assessment   Alaiza  NAW LASALA is a 43 y.o. female presenting today with a history chronic fluctuating bowel habits that have historically been between constipation and diarrhea but now most recently predominantly diarrhea with abdominal pain and debilitating symptoms.   History reviewed with patient, and she endorses issues dating back to 2008 but have escalated over past few months. Colonoscopy recently completed unrevealing. CBC, CMP, TSH, celiac panel all normal. Although historically she has felt she had overflow diarrhea at times from baseline constipation, this feels considerably different and notes incontinence, tenesmus, urgency, significant LLQ abdominal pain, greasy stool, and greatly reduced quality of life to the point she maps out her day according to bathrooms.  Will update CRP, sed rate, fecal calprotectin, and I'm adding fecal elastase as well to be thorough although doubt she has EPI. However, this can help guide management and may need dedicated small bowel imaging or capsule study. I do note it is interesting she had improvement with symptoms taking prednisone for a brief course.   If these labs are negative, we will do a course of  Xifaxan 550 mg TID for 14 days. She may also be a good candidate for a TCA in evening.      PLAN   Sed rate, CRP, fecal calprotectin, fecal elastase If negative, will do course of Xifaxan TID for 2 weeks May need dedicated small bowel imaging Consider trial of sucraid Consider low dose TCA after review of labs    Gelene Mink, PhD, ANP-BC [facility]

## 2024-01-31 LAB — C-REACTIVE PROTEIN: CRP: <1 mg/L (ref 0–10)

## 2024-01-31 LAB — SEDIMENTATION RATE: Sed Rate: 4 mm/h (ref 0–32)

## 2024-02-02 LAB — CALPROTECTIN, FECAL: Calprotectin, Fecal: 16 ug/g (ref 0–120)

## 2024-02-02 LAB — PANCREATIC ELASTASE, FECAL: Pancreatic Elastase, Fecal: >800 ug Elast./g (ref >200)

## 2024-03-28 ENCOUNTER — Encounter: Payer: Self-pay | Admitting: Gastroenterology

## 2024-05-09 ENCOUNTER — Encounter: Payer: Self-pay | Admitting: *Deleted

## 2024-05-09 ENCOUNTER — Ambulatory Visit: Admitting: Gastroenterology

## 2024-05-09 ENCOUNTER — Telehealth: Payer: Self-pay | Admitting: *Deleted

## 2024-05-09 ENCOUNTER — Other Ambulatory Visit: Payer: Self-pay | Admitting: *Deleted

## 2024-05-09 VITALS — BP 111/80 | HR 88 | Temp 98.7°F | Ht 70.0 in | Wt 174.0 lb

## 2024-05-09 DIAGNOSIS — K5900 Constipation, unspecified: Secondary | ICD-10-CM | POA: Diagnosis not present

## 2024-05-09 DIAGNOSIS — R197 Diarrhea, unspecified: Secondary | ICD-10-CM

## 2024-05-09 DIAGNOSIS — R110 Nausea: Secondary | ICD-10-CM | POA: Diagnosis not present

## 2024-05-09 DIAGNOSIS — R630 Anorexia: Secondary | ICD-10-CM

## 2024-05-09 DIAGNOSIS — R109 Unspecified abdominal pain: Secondary | ICD-10-CM

## 2024-05-09 DIAGNOSIS — K219 Gastro-esophageal reflux disease without esophagitis: Secondary | ICD-10-CM

## 2024-05-09 DIAGNOSIS — R1010 Upper abdominal pain, unspecified: Secondary | ICD-10-CM | POA: Diagnosis not present

## 2024-05-09 DIAGNOSIS — K529 Noninfective gastroenteritis and colitis, unspecified: Secondary | ICD-10-CM

## 2024-05-09 MED ORDER — PREDNISONE 10 MG PO TABS: ORAL_TABLET | ORAL | 0 refills | Status: AC

## 2024-05-09 MED ORDER — PANTOPRAZOLE SODIUM 40 MG PO TBEC: 40.0000 mg | DELAYED_RELEASE_TABLET | Freq: Every day | ORAL | 3 refills | Status: AC

## 2024-05-09 NOTE — Telephone Encounter (Signed)
 Faxed clinicals to [facility]

## 2024-05-09 NOTE — Patient Instructions (Addendum)
 I am ordering a CT scan to assess your small intestine. You will need to take prednisone 50 mg 13 hours before, 7 hours before, and 1 hour before the scan. You will also need to take Benadryl 50 mg one hour before the scan. I sent prednisone to the pharmacy.  We are arranging an upper endoscopy with Dr. Mordechai April as well.  I have sent in pantoprazole  to take once each morning, 30 minutes before breakfast.  Please have labs done fasting, first thing in the morning.   For relief today: take 1 capful of Miralax  in 8 ounces of water  every hour for 6 doses, or until you start getting relief. You can take a suppository or enema as well if needed!  I am sending in a request for Sucraid. The company will be in touch with you to get this started. It will give you a several day trial, and we can see if this helps.   Half of the mixture is taken before the start of the meal or snack and the other half of the mixture is taken about halfway through the meal or snack. Do not mix Sucraid with fruit juice or hot beverages as they may reduce the effectiveness of Sucraid.   Further recommendations to follow!   I enjoyed seeing you again today! I value our relationship and want to provide genuine, compassionate, and quality care. You may receive a survey regarding your visit with me, and I welcome your feedback! Thanks so much for taking the time to complete this. I look forward to seeing you again.      Delman Ferns, PhD, ANP-BC [facility]

## 2024-05-09 NOTE — Telephone Encounter (Signed)
 Called [facility] and spoke with Dossie Gayer, No Prior Authorization required for CT.  Re# [id number]

## 2024-05-09 NOTE — Progress Notes (Signed)
 Gastroenterology Office Note     Primary Care Physician:  Minus Amel, MD  Primary Gastroenterologist: Dr. Mordechai April    Chief Complaint   Chief Complaint  Patient presents with   Follow-up    Follow up on abd pain, nausea. Nausea worse in the am. Skin pain was treated by PCP (pt was bit by a tick)     History of Present Illness   Cynthia Johns is a 43 y.o. female presenting today with a history of chronic diarrhea alternating with constipation, chronic nausea, last seen in March 2025. She is here due to worsening nausea and abdominal pain. Prior evaluation thus far with colonoscopy in Feb 2025 with normal TI, normal colon, fecal cal, pancreatic elastase, CRP, sed rate all normal, celiac panel negative on 2 occasions, TSH normal, no anemia, LFTs normal.   Regarding diarrhea history: dating back many years even to high school. Cholecystectomy May 2012. 2008 first took dicyclomine . Diarrhea was present even prior to cholecystectomy. Underlying nausea.  Wakes up with nausea. Not throwing up. Gets better throughout the day. In morning is the worst. Unable to know if helps with food. Can't eat right away. Has food aversions. Decreased appetite. Feels full off of small amounts of food. Feels unsure if r/t constipation but notes early satiety.   She had no improvement with diarrhea on Xifaxan . Diarrhea with urgency, couldn't go out to eat without  postrpandial urgency. She has taken Lomotil consistently and then will have a bout of constipation. Taking AMitiza 24 mcg po BID since Saturday. 3 small pebbles on Saturday, otherwise no output in 4 days. Now very constipated after multiple doses of Lomotil. In the past, she noted improvement with prednisone that she was given for a different reason. This is the best she felt while on prednisone and relief of diarrhea.    Used to have GERD if only eating something spicy. Now pressure in stomach and feels worsened reflux. Always nauseated and not  always reflux. Multiple pregnancy tests and having periods. Zofran  up to 4 times a day.   Has taken dicyclomine  in the past and could only take small amount as would cause constipation. Doxycline for tick bite. Got bit 2 different places on legs about 4 weeks ago.    Nausea each morning since Xifaxan . Nausea has not improved. Bad reflux. Decreased appetite. Tums. Noting upper abdominal pain. Feels like bloating all upper abdomen.   In the past would be diarrhea 5-6 times per day.   Had a skin rash, toes bruising recently. Saw PCP and given doxy in case of tick borne illness.    Feb 2025 colonoscopy: non-bleeding internal hemorrhoids, normal examined portion of TI, normal colon.    Nov 2024 outside labs: celiac disease panel negative. TSH normal (1.88). no anemia. LFTS normal.  2 maternal aunts with Crohn's.  Unknown father's side history   Past Medical History:  Diagnosis Date   Anxiety    Overweight    Sinus tachycardia    SVT (supraventricular tachycardia) (HCC)     Past Surgical History:  Procedure Laterality Date   BREAST ENHANCEMENT SURGERY     CHOLECYSTECTOMY     COLONOSCOPY WITH PROPOFOL  N/A 12/29/2023   Procedure: COLONOSCOPY WITH PROPOFOL ;  Surgeon: Vinetta Greening, DO;  [facility];  Service: Endoscopy;  Laterality: N/A;  200pm, asa 2   SVT ABLATION N/A 09/12/2017   Procedure: SVT Ablation;  Surgeon: Jolly Needle, MD;  Location: [facility];  Service:  Cardiovascular;  Laterality: N/A;   TONSILLECTOMY      Current Outpatient Medications  Medication Sig Dispense Refill   ALPRAZolam (XANAX) 0.5 MG tablet Take 0.5 mg by mouth 2 (two) times daily as needed for anxiety.     diphenoxylate-atropine (LOMOTIL) 2.5-0.025 MG tablet Take by mouth 4 (four) times daily as needed for diarrhea or loose stools.     doxycycline (ADOXA) 100 MG tablet Take 100 mg by mouth 2 (two) times daily.     ondansetron  (ZOFRAN  ODT) 4 MG disintegrating tablet Take 1 tablet (4  mg total) by mouth every 8 (eight) hours as needed for nausea or vomiting. 30 tablet 1   Phenylephrine -Ibuprofen (ADVIL SINUS CONGESTION & PAIN) 10-200 MG TABS Take 1 tablet by mouth every 12 (twelve) hours as needed (congestion).     OVER THE COUNTER MEDICATION Probiotic one per day. (Patient not taking: Reported on 01/30/2024)     sulfamethoxazole-trimethoprim (BACTRIM DS) 800-160 MG tablet Take 1 tablet by mouth 2 (two) times daily. (Patient not taking: Reported on 05/09/2024)     No current facility-administered medications for this visit.    Allergies as of 05/09/2024 - Review Complete 05/09/2024  Allergen Reaction Noted   Contrast media [iodinated contrast media] Rash 04/03/2019    Family History  Problem Relation Age of Onset   Hypertension Mother     Social History   Socioeconomic History   Marital status: Married    Spouse name: Not on file   Number of children: Not on file   Years of education: Not on file   Highest education level: Not on file  Occupational History   Not on file  Tobacco Use   Smoking status: Never   Smokeless tobacco: Never  Vaping Use   Vaping status: Never Used  Substance and Sexual Activity   Alcohol use: No   Drug use: No   Sexual activity: Yes    Birth control/protection: Inserts  Other Topics Concern   Not on file  Social History Narrative   Lives in [place] with spouse and daughter   Works at [employer] as a Associate Professor   Social Drivers of Corporate investment banker Strain: Not on file  Food Insecurity: Not on file  Transportation Needs: Not on file  Physical Activity: Not on file  Stress: Not on file  Social Connections: Unknown (03/26/2022)   Received from [facility]   Social Network    Social Network: Not on file  Intimate Partner Violence: Unknown (02/21/2022)   Received from [facility]   HITS    Physically Hurt: Not on file    Insult or Talk Down To: Not on file    Threaten Physical Harm: Not on file    Scream  or Curse: Not on file     Review of Systems   Gen: Denies any fever, chills, fatigue, weight loss, lack of appetite.  CV: Denies chest pain, heart palpitations, peripheral edema, syncope.  Resp: Denies shortness of breath at rest or with exertion. Denies wheezing or cough.  GI: Denies dysphagia or odynophagia. Denies jaundice, hematemesis, fecal incontinence. GU : Denies urinary burning, urinary frequency, urinary hesitancy MS: Denies joint pain, muscle weakness, cramps, or limitation of movement.  Derm: Denies rash, itching, dry skin Psych: Denies depression, anxiety, memory loss, and confusion Heme: Denies bruising, bleeding, and enlarged lymph nodes.   Physical Exam   BP 111/80   Pulse 88   Temp 98.7 F (37.1 C)   Ht 5'  10 (1.778 m)   Wt 174 lb (78.9 kg)   LMP 04/27/2024   BMI 24.97 kg/m  General:   Alert and oriented. Pleasant and cooperative. Well-nourished and well-developed.  Head:  Normocephalic and atraumatic. Eyes:  Without icterus Abdomen:  +BS, soft, non-tender and non-distended. No HSM noted. No guarding or rebound. No masses appreciated.  Rectal:  Deferred  Msk:  Symmetrical without gross deformities. Normal posture. Extremities:  Without edema. Neurologic:  Alert and  oriented x4;  grossly normal neurologically. Skin:  Intact without significant lesions or rashes. Psych:  Alert and cooperative. Normal mood and affect.   CBC, TSH, CMP on file June 2025. Unrevealing.    Assessment   Gayathri LYNASIA MELOCHE is a 43 y.o. female presenting today with a history of  chronic diarrhea alternating with constipation, chronic nausea, last seen in March 2025, now with worsening nausea and abdominal discomfort.    Nausea: chronic but now worsening in intensity and pronounced in the morning, associated worsened GERD, early satiety, lack of appetite, upper abdominal pain. She is having a bout of constipation after multiple doses of Lomotil which could contribute to the  discomfort and worsening nausea but notes this present prior to this. As she has had persistent issues, will arrange EGD and add PPI. Prior celiac serologies negative. Cortisol ordered. Alpha gal panel ordered as recently bit bt tick.    Diarrhea: chronic intermittent dating back to high school. No improvement with Xifaxan . Inflammatory markers normal including crp, sed rate, fecal cal, and she has normal fecal elastase > 800. Negative celiac serologies multiple times. Colonoscopy earlier this year without signs of IBD and had normal TI. Low doses of dicyclomine  will cause constipation; she has had to take lomotil multiple days in a row to obtain relief but then becomes constipated. She is concerned about underlying autoimmune process. Low likelihood of Crohn's with normal inflammatory markers and fecal cal; she has met her deductible and would like further testing. Will pursue CTE in near future. Highly doubt dealing with an NET. 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   Constipation: acute onset after multiple doses of Lomotil. Her baseline is diarrhea. Miralax  purge today.        PLAN    Starting pantoprazole  once each morning Proceed with upper endoscopy by Dr. Mordechai April in near future: the risks, benefits, and alternatives have been discussed with the patient in detail. The patient states understanding and desires to proceed.  CTE: premedication provided per protocol Fasting cortisol, alpha gal panel Miralax  purge due to acute constipation Trial of Sucraid Consider TCA in future   Delman Ferns, PhD, [facility]

## 2024-05-09 NOTE — Telephone Encounter (Signed)
 Called [facility] and spoke with Nicholes Barks. PA required for EGD, pending auth # [id number]. Fax clinicals to [phone]

## 2024-05-09 NOTE — H&P (View-Only) (Signed)
 Gastroenterology Office Note     Primary Care Physician:  Minus Amel, MD  Primary Gastroenterologist: Dr. Mordechai April    Chief Complaint   Chief Complaint  Patient presents with   Follow-up    Follow up on abd pain, nausea. Nausea worse in the am. Skin pain was treated by PCP (pt was bit by a tick)     History of Present Illness   Cynthia Johns is a 43 y.o. female presenting today with a history of chronic diarrhea alternating with constipation, chronic nausea, last seen in March 2025. She is here due to worsening nausea and abdominal pain. Prior evaluation thus far with colonoscopy in Feb 2025 with normal TI, normal colon, fecal cal, pancreatic elastase, CRP, sed rate all normal, celiac panel negative on 2 occasions, TSH normal, no anemia, LFTs normal.   Regarding diarrhea history: dating back many years even to high school. Cholecystectomy May 2012. 2008 first took dicyclomine . Diarrhea was present even prior to cholecystectomy. Underlying nausea.  Wakes up with nausea. Not throwing up. Gets better throughout the day. In morning is the worst. Unable to know if helps with food. Can't eat right away. Has food aversions. Decreased appetite. Feels full off of small amounts of food. Feels unsure if r/t constipation but notes early satiety.   She had no improvement with diarrhea on Xifaxan . Diarrhea with urgency, couldn't go out to eat without  postrpandial urgency. She has taken Lomotil consistently and then will have a bout of constipation. Taking AMitiza 24 mcg po BID since Saturday. 3 small pebbles on Saturday, otherwise no output in 4 days. Now very constipated after multiple doses of Lomotil. In the past, she noted improvement with prednisone that she was given for a different reason. This is the best she felt while on prednisone and relief of diarrhea.    Used to have GERD if only eating something spicy. Now pressure in stomach and feels worsened reflux. Always nauseated and not  always reflux. Multiple pregnancy tests and having periods. Zofran  up to 4 times a day.   Has taken dicyclomine  in the past and could only take small amount as would cause constipation. Doxycline for tick bite. Got bit 2 different places on legs about 4 weeks ago.    Nausea each morning since Xifaxan . Nausea has not improved. Bad reflux. Decreased appetite. Tums. Noting upper abdominal pain. Feels like bloating all upper abdomen.   In the past would be diarrhea 5-6 times per day.   Had a skin rash, toes bruising recently. Saw PCP and given doxy in case of tick borne illness.    Feb 2025 colonoscopy: non-bleeding internal hemorrhoids, normal examined portion of TI, normal colon.    Nov 2024 outside labs: celiac disease panel negative. TSH normal (1.88). no anemia. LFTS normal.  2 maternal aunts with Crohn's.  Unknown father's side history   Past Medical History:  Diagnosis Date   Anxiety    Overweight    Sinus tachycardia    SVT (supraventricular tachycardia) (HCC)     Past Surgical History:  Procedure Laterality Date   BREAST ENHANCEMENT SURGERY     CHOLECYSTECTOMY     COLONOSCOPY WITH PROPOFOL  N/A 12/29/2023   Procedure: COLONOSCOPY WITH PROPOFOL ;  Surgeon: Vinetta Greening, DO;  Location: [facility];  Service: Endoscopy;  Laterality: N/A;  200pm, asa 2   SVT ABLATION N/A 09/12/2017   Procedure: SVT Ablation;  Surgeon: Jolly Needle, MD;  Location: [facility] INVASIVE CV LAB;  Service:  Cardiovascular;  Laterality: N/A;   TONSILLECTOMY      Current Outpatient Medications  Medication Sig Dispense Refill   ALPRAZolam (XANAX) 0.5 MG tablet Take 0.5 mg by mouth 2 (two) times daily as needed for anxiety.     diphenoxylate-atropine (LOMOTIL) 2.5-0.025 MG tablet Take by mouth 4 (four) times daily as needed for diarrhea or loose stools.     doxycycline (ADOXA) 100 MG tablet Take 100 mg by mouth 2 (two) times daily.     ondansetron  (ZOFRAN  ODT) 4 MG disintegrating tablet Take 1 tablet (4  mg total) by mouth every 8 (eight) hours as needed for nausea or vomiting. 30 tablet 1   Phenylephrine -Ibuprofen (ADVIL SINUS CONGESTION & PAIN) 10-200 MG TABS Take 1 tablet by mouth every 12 (twelve) hours as needed (congestion).     OVER THE COUNTER MEDICATION Probiotic one per day. (Patient not taking: Reported on 01/30/2024)     sulfamethoxazole-trimethoprim (BACTRIM DS) 800-160 MG tablet Take 1 tablet by mouth 2 (two) times daily. (Patient not taking: Reported on 05/09/2024)     No current facility-administered medications for this visit.    Allergies as of 05/09/2024 - Review Complete 05/09/2024  Allergen Reaction Noted   Contrast media [iodinated contrast media] Rash 04/03/2019    Family History  Problem Relation Age of Onset   Hypertension Mother     Social History   Socioeconomic History   Marital status: Married    Spouse name: Not on file   Number of children: Not on file   Years of education: Not on file   Highest education level: Not on file  Occupational History   Not on file  Tobacco Use   Smoking status: Never   Smokeless tobacco: Never  Vaping Use   Vaping status: Never Used  Substance and Sexual Activity   Alcohol use: No   Drug use: No   Sexual activity: Yes    Birth control/protection: Inserts  Other Topics Concern   Not on file  Social History Narrative   Lives in [place] with spouse and daughter   Works at [employer] as a Associate Professor   Social Drivers of Corporate investment banker Strain: Not on file  Food Insecurity: Not on file  Transportation Needs: Not on file  Physical Activity: Not on file  Stress: Not on file  Social Connections: Unknown (03/26/2022)   Received from [employer]   Social Network    Social Network: Not on file  Intimate Partner Violence: Unknown (02/21/2022)   Received from [facility]   HITS    Physically Hurt: Not on file    Insult or Talk Down To: Not on file    Threaten Physical Harm: Not on file    Scream  or Curse: Not on file     Review of Systems   Gen: Denies any fever, chills, fatigue, weight loss, lack of appetite.  CV: Denies chest pain, heart palpitations, peripheral edema, syncope.  Resp: Denies shortness of breath at rest or with exertion. Denies wheezing or cough.  GI: Denies dysphagia or odynophagia. Denies jaundice, hematemesis, fecal incontinence. GU : Denies urinary burning, urinary frequency, urinary hesitancy MS: Denies joint pain, muscle weakness, cramps, or limitation of movement.  Derm: Denies rash, itching, dry skin Psych: Denies depression, anxiety, memory loss, and confusion Heme: Denies bruising, bleeding, and enlarged lymph nodes.   Physical Exam   BP 111/80   Pulse 88   Temp 98.7 F (37.1 C)   Ht 5'  10 (1.778 m)   Wt 174 lb (78.9 kg)   LMP 04/27/2024   BMI 24.97 kg/m  General:   Alert and oriented. Pleasant and cooperative. Well-nourished and well-developed.  Head:  Normocephalic and atraumatic. Eyes:  Without icterus Abdomen:  +BS, soft, non-tender and non-distended. No HSM noted. No guarding or rebound. No masses appreciated.  Rectal:  Deferred  Msk:  Symmetrical without gross deformities. Normal posture. Extremities:  Without edema. Neurologic:  Alert and  oriented x4;  grossly normal neurologically. Skin:  Intact without significant lesions or rashes. Psych:  Alert and cooperative. Normal mood and affect.   CBC, TSH, CMP on file June 2025. Unrevealing.    Assessment   Cynthia Johns is a 43 y.o. female presenting today with a history of  chronic diarrhea alternating with constipation, chronic nausea, last seen in March 2025, now with worsening nausea and abdominal discomfort.    Nausea: chronic but now worsening in intensity and pronounced in the morning, associated worsened GERD, early satiety, lack of appetite, upper abdominal pain. She is having a bout of constipation after multiple doses of Lomotil which could contribute to the  discomfort and worsening nausea but notes this present prior to this. As she has had persistent issues, will arrange EGD and add PPI. Prior celiac serologies negative. Cortisol ordered. Alpha gal panel ordered as recently bit bt tick.    Diarrhea: chronic intermittent dating back to high school. No improvement with Xifaxan . Inflammatory markers normal including crp, sed rate, fecal cal, and she has normal fecal elastase > 800. Negative celiac serologies multiple times. Colonoscopy earlier this year without signs of IBD and had normal TI. Low doses of dicyclomine  will cause constipation; she has had to take lomotil multiple days in a row to obtain relief but then becomes constipated. She is concerned about underlying autoimmune process. Low likelihood of Crohn's with normal inflammatory markers and fecal cal; she has met her deductible and would like further testing. Will pursue CTE in near future. Highly doubt dealing with an NET. 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   Constipation: acute onset after multiple doses of Lomotil. Her baseline is diarrhea. Miralax  purge today.        PLAN    Starting pantoprazole  once each morning Proceed with upper endoscopy by Dr. Mordechai April in near future: the risks, benefits, and alternatives have been discussed with the patient in detail. The patient states understanding and desires to proceed.  CTE: premedication provided per protocol Fasting cortisol, alpha gal panel Miralax  purge due to acute constipation Trial of Sucraid Consider TCA in future   Delman Ferns, PhD, [place] [facility]astroenter[facility]

## 2024-05-10 ENCOUNTER — Telehealth: Payer: Self-pay

## 2024-05-10 NOTE — Telephone Encounter (Signed)
 REF:  Rochelle Aultman         DOB:  May 09, 1981 MembNo:  [id number]     Please be advised that the following has been APPROVED for medical necessity  CPT Code: 98119  Date of Service: 05/14/2024  Facility: [facility]  Authorization #: [id number]  Pre-authorization is not a guarantee of payment. Siegfried Dress is date specific.  Please contact me if I can be of further assistance!

## 2024-05-10 NOTE — Telephone Encounter (Signed)
 Referral and insurance filled out and faxed to [employer] for free 4-Day trial (4DT). Waiting on a response

## 2024-05-13 ENCOUNTER — Ambulatory Visit (HOSPITAL_COMMUNITY)
Admission: RE | Admit: 2024-05-13 | Discharge: 2024-05-13 | Disposition: A | Discharge status: Home / Self Care | Source: Ambulatory Visit | Attending: Gastroenterology | Admitting: Gastroenterology

## 2024-05-13 ENCOUNTER — Ambulatory Visit: Payer: Self-pay | Admitting: Gastroenterology

## 2024-05-13 ENCOUNTER — Other Ambulatory Visit (HOSPITAL_COMMUNITY)
Admission: RE | Admit: 2024-05-13 | Discharge: 2024-05-13 | Disposition: A | Discharge status: Home / Self Care | Source: Ambulatory Visit | Attending: Internal Medicine | Admitting: Internal Medicine

## 2024-05-13 DIAGNOSIS — R197 Diarrhea, unspecified: Secondary | ICD-10-CM | POA: Insufficient documentation

## 2024-05-13 DIAGNOSIS — K219 Gastro-esophageal reflux disease without esophagitis: Secondary | ICD-10-CM | POA: Insufficient documentation

## 2024-05-13 DIAGNOSIS — R110 Nausea: Secondary | ICD-10-CM

## 2024-05-13 DIAGNOSIS — R109 Unspecified abdominal pain: Secondary | ICD-10-CM | POA: Insufficient documentation

## 2024-05-13 LAB — PREGNANCY, URINE: Preg Test, Ur: NEGATIVE

## 2024-05-13 MED ORDER — IOHEXOL 300 MG/ML  SOLN
100.0000 mL | Freq: Once | INTRAMUSCULAR | Status: AC | PRN
  Administered 2024-05-13: 100 mL via INTRAVENOUS

## 2024-05-13 NOTE — Telephone Encounter (Signed)
 Confirmation faxed return that it was delivered.

## 2024-05-13 NOTE — Telephone Encounter (Signed)
 Re-faxed the Sucraid paperwork to [phone].

## 2024-05-14 ENCOUNTER — Ambulatory Visit (HOSPITAL_COMMUNITY): Payer: Self-pay | Admitting: Anesthesiology

## 2024-05-14 ENCOUNTER — Encounter (HOSPITAL_COMMUNITY): Payer: Self-pay

## 2024-05-14 ENCOUNTER — Ambulatory Visit (HOSPITAL_COMMUNITY)
Admission: RE | Admit: 2024-05-14 | Discharge: 2024-05-14 | Disposition: A | Discharge status: Home / Self Care | Attending: Internal Medicine | Admitting: Internal Medicine

## 2024-05-14 ENCOUNTER — Ambulatory Visit (HOSPITAL_COMMUNITY): Admit: 2024-05-14 | Admitting: Internal Medicine

## 2024-05-14 ENCOUNTER — Other Ambulatory Visit: Payer: Self-pay

## 2024-05-14 ENCOUNTER — Encounter (HOSPITAL_COMMUNITY)
Admission: RE | Disposition: A | Discharge status: Home / Self Care | Payer: Self-pay | Source: Home / Self Care | Attending: Internal Medicine

## 2024-05-14 ENCOUNTER — Encounter (HOSPITAL_COMMUNITY): Payer: Self-pay | Admitting: Internal Medicine

## 2024-05-14 DIAGNOSIS — F419 Anxiety disorder, unspecified: Secondary | ICD-10-CM | POA: Diagnosis not present

## 2024-05-14 DIAGNOSIS — K2970 Gastritis, unspecified, without bleeding: Secondary | ICD-10-CM | POA: Diagnosis not present

## 2024-05-14 DIAGNOSIS — I10 Essential (primary) hypertension: Secondary | ICD-10-CM | POA: Insufficient documentation

## 2024-05-14 DIAGNOSIS — R1013 Epigastric pain: Secondary | ICD-10-CM | POA: Diagnosis present

## 2024-05-14 DIAGNOSIS — K219 Gastro-esophageal reflux disease without esophagitis: Secondary | ICD-10-CM | POA: Diagnosis not present

## 2024-05-14 DIAGNOSIS — R197 Diarrhea, unspecified: Secondary | ICD-10-CM | POA: Diagnosis not present

## 2024-05-14 DIAGNOSIS — K648 Other hemorrhoids: Secondary | ICD-10-CM | POA: Insufficient documentation

## 2024-05-14 DIAGNOSIS — R110 Nausea: Secondary | ICD-10-CM

## 2024-05-14 HISTORY — PX: ESOPHAGOGASTRODUODENOSCOPY: SHX5428

## 2024-05-14 LAB — ALPHA-GAL PANEL
Allergen Lamb IgE: 1.68 kU/L — AB
Beef IgE: 6.83 kU/L — AB
IgE (Immunoglobulin E), Serum: 138 [IU]/mL (ref 6–495)
O215-IgE Alpha-Gal: 13.9 kU/L — AB
Pork IgE: 2.14 kU/L — AB

## 2024-05-14 LAB — CORTISOL: Cortisol: 11.8 ug/dL (ref 6.2–19.4)

## 2024-05-14 SURGERY — EGD (ESOPHAGOGASTRODUODENOSCOPY)
Anesthesia: General

## 2024-05-14 SURGERY — EGD (ESOPHAGOGASTRODUODENOSCOPY)
Anesthesia: Choice

## 2024-05-14 MED ORDER — PROPOFOL 10 MG/ML IV BOLUS
INTRAVENOUS | Status: DC | PRN
Start: 2024-05-14 — End: 2024-05-14
  Administered 2024-05-14: 50 mg via INTRAVENOUS

## 2024-05-14 MED ORDER — LIDOCAINE 2% (20 MG/ML) 5 ML SYRINGE
INTRAMUSCULAR | Status: DC | PRN
Start: 2024-05-14 — End: 2024-05-14
  Administered 2024-05-14: 50 mg via INTRAVENOUS

## 2024-05-14 MED ORDER — LIDOCAINE 2% (20 MG/ML) 5 ML SYRINGE
INTRAMUSCULAR | Status: AC
Start: 2024-05-14 — End: 2024-05-14
  Filled 2024-05-14: qty 5

## 2024-05-14 MED ORDER — PROPOFOL 500 MG/50ML IV EMUL
INTRAVENOUS | Status: DC | PRN
  Administered 2024-05-14: 150 ug/kg/min via INTRAVENOUS

## 2024-05-14 MED ORDER — LACTATED RINGERS IV SOLN: INTRAVENOUS | Status: DC

## 2024-05-14 MED ORDER — PROPOFOL 10 MG/ML IV BOLUS
INTRAVENOUS | Status: AC
  Filled 2024-05-14: qty 20

## 2024-05-14 MED ORDER — EPINEPHRINE 0.3 MG/0.3ML IJ SOAJ: 0.3000 mg | INTRAMUSCULAR | 1 refills | Status: AC | PRN

## 2024-05-14 NOTE — Discharge Instructions (Addendum)
 EGD Discharge instructions Please read the instructions outlined below and refer to this sheet in the next few weeks. These discharge instructions provide you with general information on caring for yourself after you leave the hospital. Your doctor may also give you specific instructions. While your treatment has been planned according to the most current medical practices available, unavoidable complications occasionally occur. If you have any problems or questions after discharge, please call your doctor. ACTIVITY You may resume your regular activity but move at a slower pace for the next 24 hours.  Take frequent rest periods for the next 24 hours.  Walking will help expel (get rid of) the air and reduce the bloated feeling in your abdomen.  No driving for 24 hours (because of the anesthesia (medicine) used during the test).  You may shower.  Do not sign any important legal documents or operate any machinery for 24 hours (because of the anesthesia used during the test).  NUTRITION Drink plenty of fluids.  You may resume your normal diet.  Begin with a light meal and progress to your normal diet.  Avoid alcoholic beverages for 24 hours or as instructed by your caregiver.  MEDICATIONS You may resume your normal medications unless your caregiver tells you otherwise.  WHAT YOU CAN EXPECT TODAY You may experience abdominal discomfort such as a feeling of fullness or "gas" pains.  FOLLOW-UP Your doctor will discuss the results of your test with you.  SEEK IMMEDIATE MEDICAL ATTENTION IF ANY OF THE FOLLOWING OCCUR: Excessive nausea (feeling sick to your stomach) and/or vomiting.  Severe abdominal pain and distention (swelling).  Trouble swallowing.  Temperature over 101 F (37.8 C).  Rectal bleeding or vomiting of blood.   Your EGD revealed mild amount inflammation in your stomach.  I took biopsies of this to rule out infection with a bacteria called H. pylori.  I also took samples of your  esophagus.  Await pathology results, my office will contact you.  Small bowel was normal.  Continue on pantoprazole  daily.  Recommend avoiding red meat, pork, lamb.  We will place referral to allergist.  Follow-up in GI office in 2 to 3 months. The office will contact with appointment   I hope you have a great rest of your week!  Cynthia Johns. Cynthia Johns, D.O. Gastroenterology and Hepatology [facility]

## 2024-05-14 NOTE — Transfer of Care (Signed)
 Immediate Anesthesia Transfer of Care Note  Patient: Cynthia Johns  Procedure(s) Performed: EGD (ESOPHAGOGASTRODUODENOSCOPY)  Patient Location: PACU  Anesthesia Type:MAC  Level of Consciousness: drowsy  Airway & Oxygen Therapy: Patient Spontanous Breathing  Post-op Assessment: Report given to RN and Post -op Vital signs reviewed and stable  Post vital signs: Reviewed and stable  Last Vitals:  Vitals Value Taken Time  BP 89/49 13:51 05/14/24  Temp    Pulse 82 13:51 05/14/24  Resp 18 13:51 05/14/24  SpO2 94% on RA 13:51 05/14/24    Last Pain:  Vitals:   05/14/24 1337  TempSrc:   PainSc: 2       Patients Stated Pain Goal: 1 (05/14/24 1321)  Complications: No notable events documented.

## 2024-05-14 NOTE — Interval H&P Note (Signed)
 History and Physical Interval Note:  05/14/2024 1:24 PM  Cynthia Johns  has presented today for surgery, with the diagnosis of GERD, nausea.  The various methods of treatment have been discussed with the patient and family. After consideration of risks, benefits and other options for treatment, the patient has consented to  Procedure(s) with comments: EGD (ESOPHAGOGASTRODUODENOSCOPY) (N/A) - 2:30 pm, asa 2 as a surgical intervention.  The patient's history has been reviewed, patient examined, no change in status, stable for surgery.  I have reviewed the patient's chart and labs.  Questions were answered to the patient's satisfaction.     Carlin MARLA Hasty

## 2024-05-14 NOTE — Op Note (Signed)
 [facility] Patient Name: Cynthia Johns Procedure Date: 05/14/2024 1:25 PM MRN: [id number] Date of Birth: 08/20/1981 Attending MD: Carlin POUR. Cindie , [place], [id number] CSN: [id number] Age: 43 Admit Type: Outpatient Procedure:                Upper GI endoscopy Indications:              Epigastric abdominal pain, Diarrhea, Nausea Providers:                Carlin POUR. Cindie, DO, [address], [place]                            Goins, Kristine L. Shirlean Balm, Technician Referring MD:              Medicines:                See the Anesthesia note for documentation of the                            administered medications Complications:            No immediate complications. Estimated Blood Loss:     Estimated blood loss was minimal. Procedure:                Pre-Anesthesia Assessment:                           - The anesthesia plan was to use monitored                            anesthesia care (MAC).                           After obtaining informed consent, the endoscope was                            passed under direct vision. Throughout the                            procedure, the patient's blood pressure, pulse, and                            oxygen saturations were monitored continuously. The                            GIF-H190 ([id number]) scope was introduced through the                            mouth, and advanced to the second part of duodenum.                            The upper GI endoscopy was accomplished without                            difficulty. The patient tolerated the procedure  well. Scope In: 1:41:57 PM Scope Out: 1:45:42 PM Total Procedure Duration: 0 hours 3 minutes 45 seconds  Findings:      The Z-line was regular and was found 36 cm from the incisors.      Biopsies were taken with a cold forceps in the middle third of the       esophagus for histology.      Patchy mild inflammation characterized by erythema was found in the        gastric body and in the gastric antrum. Biopsies were taken with a cold       forceps for Helicobacter pylori testing.      The duodenal bulb, first portion of the duodenum and second portion of       the duodenum were normal. Impression:               - Z-line regular, 36 cm from the incisors.                           - Gastritis. Biopsied.                           - Normal duodenal bulb, first portion of the                            duodenum and second portion of the duodenum.                           - Biopsies were taken with a cold forceps for                            histology in the middle third of the esophagus. Moderate Sedation:      Per Anesthesia Care Recommendation:           - Patient has a contact number available for                            emergencies. The signs and symptoms of potential                            delayed complications were discussed with the                            patient. Return to normal activities tomorrow.                            Written discharge instructions were provided to the                            patient.                           - Continue present medications.                           - Await pathology results.                           -  Return to GI clinic in 8 weeks.                           - Use Protonix  (pantoprazole ) 40 mg PO daily.                           - Avoid red meat, pork, and lamb. Agree with                            allergy referral. Procedure Code(s):        --- Professional ---                           [phone], Esophagogastroduodenoscopy, flexible,                            transoral; with biopsy, single or multiple Diagnosis Code(s):        --- Professional ---                           K29.70, Gastritis, unspecified, without bleeding                           R10.13, Epigastric pain                           R19.7, Diarrhea, unspecified                           R11.0, Nausea CPT  copyright 2022 American Medical Association. All rights reserved. The codes documented in this report are preliminary and upon coder review may  be revised to meet current compliance requirements. Carlin POUR. Cindie, DO Carlin POUR. Cindie, DO 05/14/2024 1:49:26 PM This report has been signed electronically. Number of Addenda: 0

## 2024-05-14 NOTE — Anesthesia Preprocedure Evaluation (Signed)
 Anesthesia Evaluation  Patient identified by MRN, date of birth, ID band Patient awake    Reviewed: Allergy & Precautions, H&P , NPO status , Patient's Chart, lab work & pertinent test results, reviewed documented beta blocker date and time   Airway Mallampati: II  TM Distance: >3 FB Neck ROM: full    Dental no notable dental hx.    Pulmonary neg pulmonary ROS   Pulmonary exam normal breath sounds clear to auscultation       Cardiovascular Exercise Tolerance: Good hypertension, negative cardio ROS  Rhythm:regular Rate:Normal     Neuro/Psych  PSYCHIATRIC DISORDERS Anxiety Depression    negative neurological ROS  negative psych ROS   GI/Hepatic negative GI ROS, Neg liver ROS,GERD  ,,  Endo/Other  negative endocrine ROS    Renal/GU negative Renal ROS  negative genitourinary   Musculoskeletal   Abdominal   Peds  Hematology negative hematology ROS (+)   Anesthesia Other Findings   Reproductive/Obstetrics negative OB ROS                             Anesthesia Physical Anesthesia Plan  ASA: 2  Anesthesia Plan: General   Post-op Pain Management:    Induction:   PONV Risk Score and Plan: Propofol infusion  Airway Management Planned:   Additional Equipment:   Intra-op Plan:   Post-operative Plan:   Informed Consent: I have reviewed the patients History and Physical, chart, labs and discussed the procedure including the risks, benefits and alternatives for the proposed anesthesia with the patient or authorized representative who has indicated his/her understanding and acceptance.     Dental Advisory Given  Plan Discussed with: CRNA  Anesthesia Plan Comments:        Anesthesia Quick Evaluation

## 2024-05-15 ENCOUNTER — Encounter (HOSPITAL_COMMUNITY): Payer: Self-pay | Admitting: Internal Medicine

## 2024-05-15 ENCOUNTER — Other Ambulatory Visit: Payer: Self-pay | Admitting: *Deleted

## 2024-05-15 DIAGNOSIS — Z91018 Allergy to other foods: Secondary | ICD-10-CM

## 2024-05-15 LAB — SURGICAL PATHOLOGY

## 2024-05-15 NOTE — Progress Notes (Signed)
 noted

## 2024-05-15 NOTE — Anesthesia Postprocedure Evaluation (Signed)
 Anesthesia Post Note  Patient: Cynthia Johns  Procedure(s) Performed: EGD (ESOPHAGOGASTRODUODENOSCOPY)  Patient location during evaluation: Phase II Anesthesia Type: General Level of consciousness: awake Pain management: pain level controlled Vital Signs Assessment: post-procedure vital signs reviewed and stable Respiratory status: spontaneous breathing and respiratory function stable Cardiovascular status: blood pressure returned to baseline and stable Postop Assessment: no headache and no apparent nausea or vomiting Anesthetic complications: no Comments: Late entry   No notable events documented.   Last Vitals:  Vitals:   05/14/24 1353 05/14/24 1358  BP:  102/67  Pulse: 89 80  Resp: 20 20  Temp:    SpO2: 97% 95%    Last Pain:  Vitals:   05/14/24 1358  TempSrc:   PainSc: 0-No pain                 Cynthia Johns

## 2024-05-20 ENCOUNTER — Telehealth: Payer: Self-pay

## 2024-05-20 NOTE — Telephone Encounter (Signed)
 RICK Kung,  Pt's documentation from [employer] regarding her Sucraid has been scanned to her chart. It is being shipped to her 05/21/2024 @ no cost to her.

## 2024-05-21 NOTE — Telephone Encounter (Signed)
 Great!

## 2024-05-21 NOTE — Telephone Encounter (Signed)
 noted

## 2024-05-23 ENCOUNTER — Ambulatory Visit: Admitting: Gastroenterology

## 2024-05-27 ENCOUNTER — Telehealth: Payer: Self-pay

## 2024-05-27 NOTE — Telephone Encounter (Signed)
 I am scanning into the pt's chart information regarding the Sucraid where the pt is not wanting to be on this medication. Give it a few minutes

## 2024-05-27 NOTE — Telephone Encounter (Signed)
 Referral faxed to Dr. Glendia Commins.

## 2024-05-27 NOTE — Addendum Note (Signed)
 Addended by: GAYLENE MADELIN CROME on: 05/27/2024 04:13 PM   Modules accepted: Orders

## 2024-05-27 NOTE — Telephone Encounter (Signed)
 Please see patient's request. I believe we already referred to Dr. Iva but she is requesting another location. Thanks!

## 2024-05-27 NOTE — Telephone Encounter (Signed)
 Patient did not have any success with Sucraid trial. She has elected not to continue.

## 2024-05-28 NOTE — Telephone Encounter (Signed)
 Referral faxed

## 2024-05-28 NOTE — Telephone Encounter (Signed)
 Thanks! I already sent to [facility] and Tammy to refer there.

## 2024-05-28 NOTE — Telephone Encounter (Signed)
 noted

## 2024-06-04 ENCOUNTER — Ambulatory Visit: Payer: Self-pay | Admitting: Internal Medicine

## 2024-07-10 ENCOUNTER — Encounter: Payer: Self-pay | Admitting: Internal Medicine
